# Patient Record
Sex: Male | Born: 1990 | Race: Black or African American | Hispanic: No | Marital: Single | Smoking: Current every day smoker
Health system: Southern US, Community
[De-identification: ages and names within clinical notes are randomized; demographics above are authoritative.]

## PROBLEM LIST (undated history)

## (undated) DIAGNOSIS — F419 Anxiety disorder, unspecified: Secondary | ICD-10-CM

## (undated) DIAGNOSIS — I10 Essential (primary) hypertension: Secondary | ICD-10-CM

## (undated) DIAGNOSIS — F32A Depression, unspecified: Secondary | ICD-10-CM

## (undated) HISTORY — DX: Anxiety disorder, unspecified: F41.9

## (undated) HISTORY — DX: Depression, unspecified: F32.A

---

## 2013-10-13 ENCOUNTER — Emergency Department: Payer: Self-pay | Admitting: Emergency Medicine

## 2013-10-13 LAB — CBC
HCT: 51.6 % (ref 40.0–52.0)
HGB: 17.6 g/dL (ref 13.0–18.0)
MCH: 32.7 pg (ref 26.0–34.0)
MCHC: 34.1 g/dL (ref 32.0–36.0)
MCV: 96 fL (ref 80–100)
PLATELETS: 214 10*3/uL (ref 150–440)
RBC: 5.4 10*6/uL (ref 4.40–5.90)
RDW: 12.8 % (ref 11.5–14.5)
WBC: 8.4 10*3/uL (ref 3.8–10.6)

## 2013-10-13 LAB — BASIC METABOLIC PANEL
ANION GAP: 4 — AB (ref 7–16)
BUN: 9 mg/dL (ref 7–18)
CALCIUM: 8.8 mg/dL (ref 8.5–10.1)
CHLORIDE: 106 mmol/L (ref 98–107)
Co2: 30 mmol/L (ref 21–32)
Creatinine: 0.89 mg/dL (ref 0.60–1.30)
EGFR (African American): >60
EGFR (Non-African Amer.): >60
Glucose: 102 mg/dL — ABNORMAL HIGH (ref 65–99)
Osmolality: 278 (ref 275–301)
POTASSIUM: 3.3 mmol/L — AB (ref 3.5–5.1)
SODIUM: 140 mmol/L (ref 136–145)

## 2013-10-13 LAB — TROPONIN I: Troponin-I: <0.02 ng/mL

## 2014-01-06 ENCOUNTER — Emergency Department: Payer: Self-pay | Admitting: Emergency Medicine

## 2014-02-27 ENCOUNTER — Emergency Department: Payer: Self-pay | Admitting: Emergency Medicine

## 2014-02-27 LAB — BASIC METABOLIC PANEL
Anion Gap: 7 (ref 7–16)
BUN: 9 mg/dL (ref 7–18)
Calcium, Total: 8.6 mg/dL (ref 8.5–10.1)
Chloride: 105 mmol/L (ref 98–107)
Co2: 29 mmol/L (ref 21–32)
Creatinine: 0.9 mg/dL (ref 0.60–1.30)
EGFR (African American): >60
EGFR (Non-African Amer.): >60
GLUCOSE: 90 mg/dL (ref 65–99)
Osmolality: 279 (ref 275–301)
Potassium: 3.8 mmol/L (ref 3.5–5.1)
Sodium: 141 mmol/L (ref 136–145)

## 2014-02-27 LAB — CBC
HCT: 51.1 % (ref 40.0–52.0)
HGB: 17.3 g/dL (ref 13.0–18.0)
MCH: 32.3 pg (ref 26.0–34.0)
MCHC: 33.8 g/dL (ref 32.0–36.0)
MCV: 96 fL (ref 80–100)
PLATELETS: 240 10*3/uL (ref 150–440)
RBC: 5.35 10*6/uL (ref 4.40–5.90)
RDW: 13.1 % (ref 11.5–14.5)
WBC: 8.1 10*3/uL (ref 3.8–10.6)

## 2014-02-27 LAB — TROPONIN I

## 2015-12-16 IMAGING — CR DG CHEST 2V
1 series · 2 of 2 positions shown · non-contrast
Comparison: 11/11/2008

CLINICAL DATA: Chest pain

EXAM:
CHEST  2 VIEW

[Series 1: pa · 0.17mm/px · 2 of 2 slices shown]
[im 1/2]
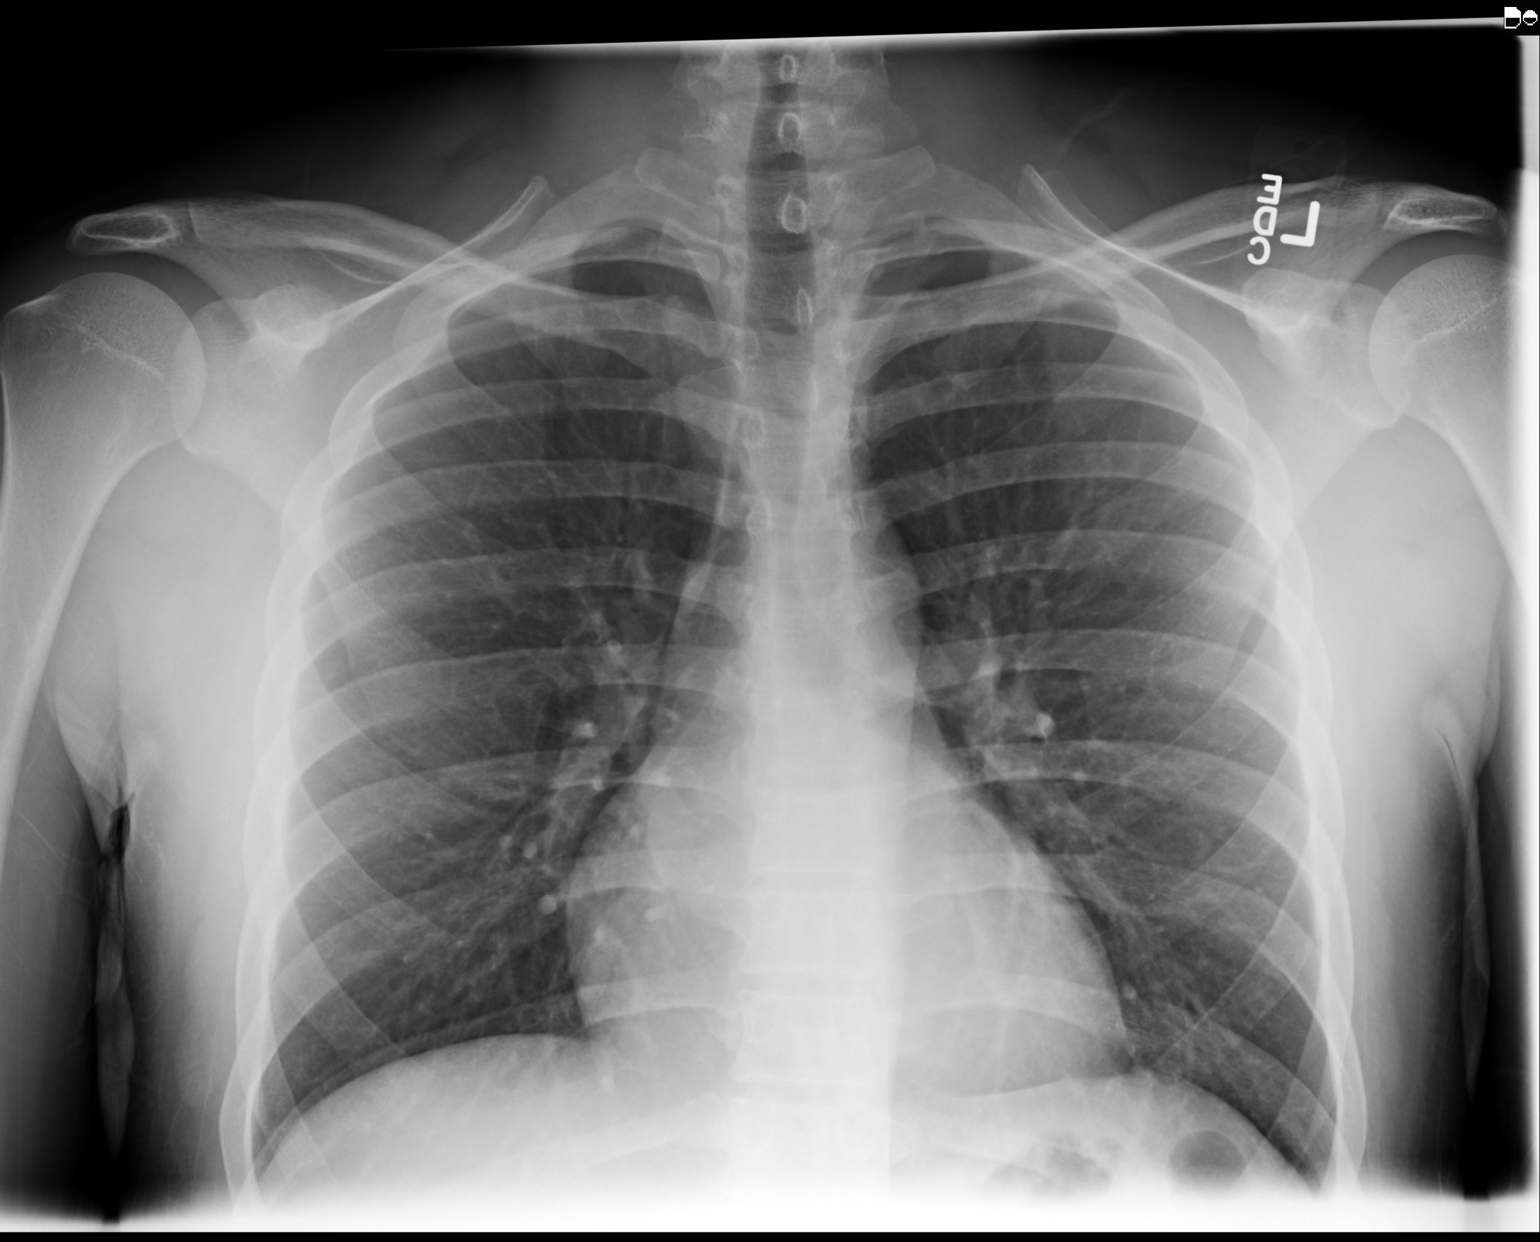
[im 2/2]
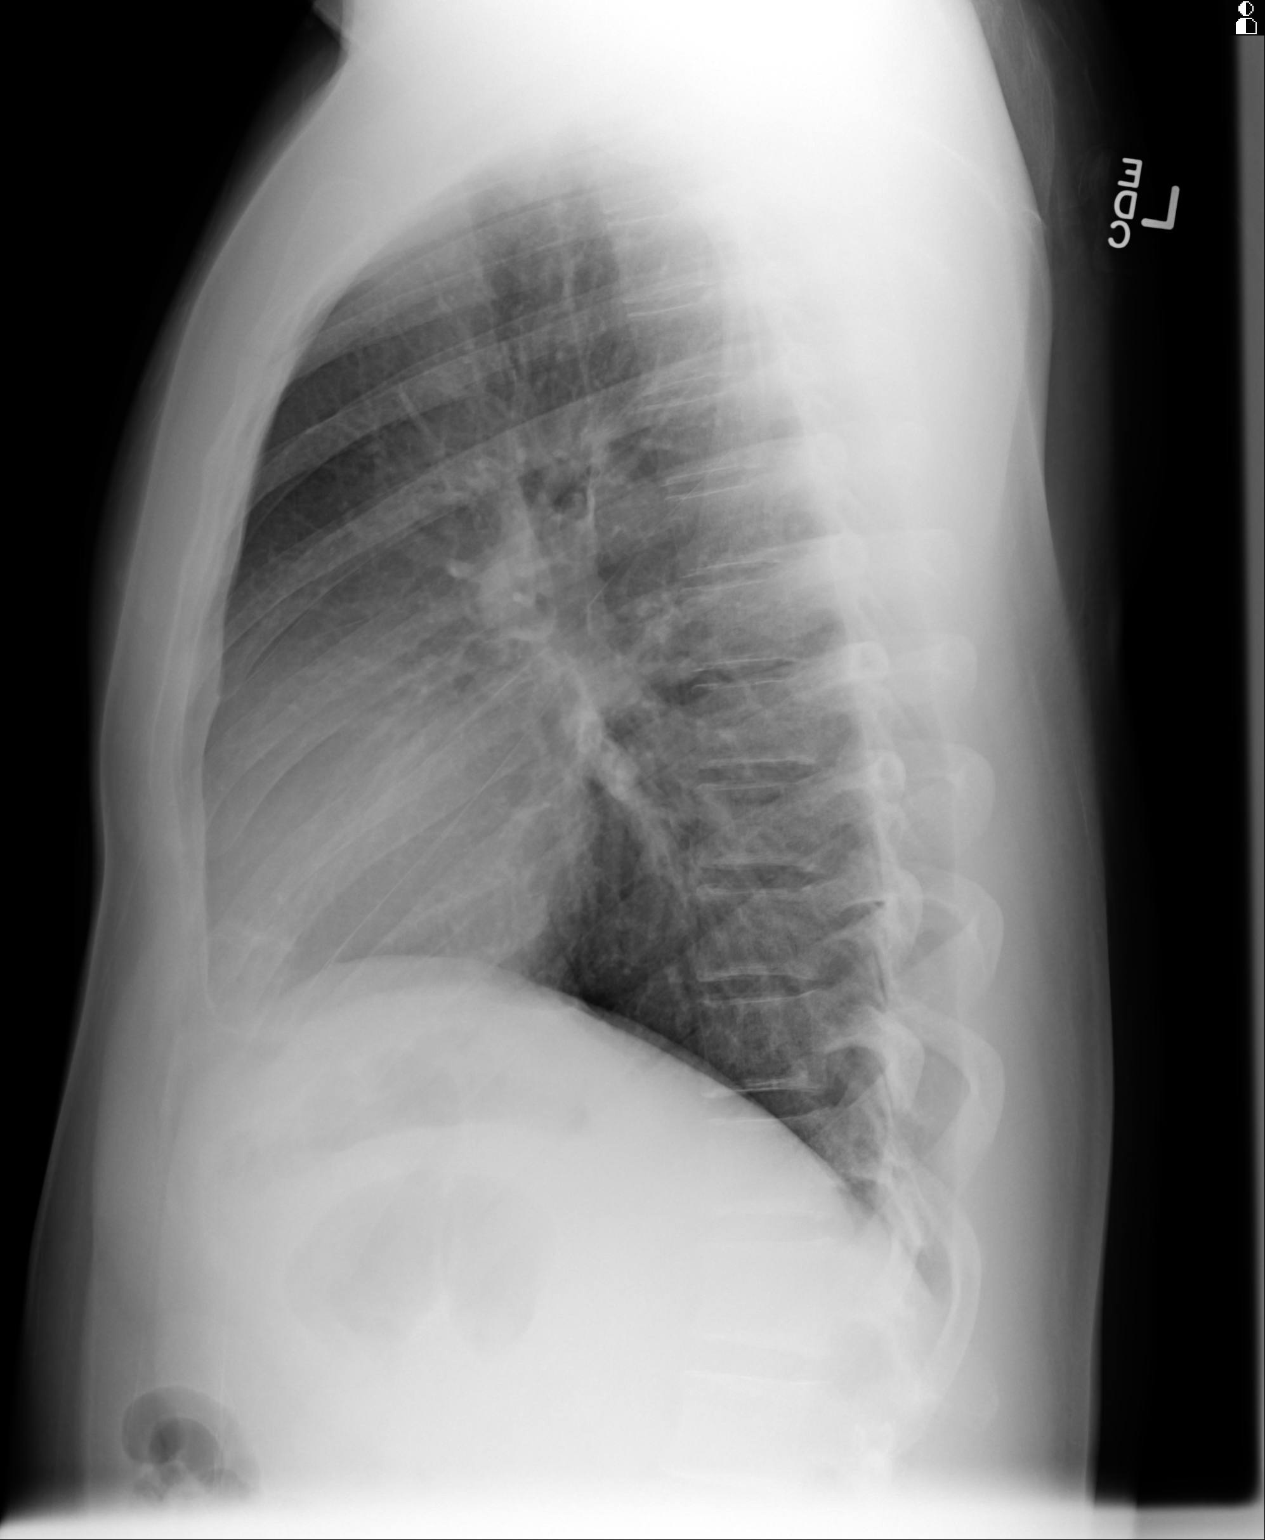

[2 of 2 positions shown; findings below may reference images not displayed]

FINDINGS: The heart size and mediastinal contours are within normal limits.
Both lungs are clear. The visualized skeletal structures are
unremarkable.
IMPRESSION: No active cardiopulmonary disease.

## 2016-10-04 ENCOUNTER — Emergency Department: Payer: PRIVATE HEALTH INSURANCE

## 2016-10-04 ENCOUNTER — Encounter: Payer: Self-pay | Admitting: Emergency Medicine

## 2016-10-04 ENCOUNTER — Emergency Department
Admission: EM | Admit: 2016-10-04 | Discharge: 2016-10-04 | Disposition: A | Discharge status: Home / Self Care | Payer: PRIVATE HEALTH INSURANCE | Attending: Emergency Medicine | Admitting: Emergency Medicine

## 2016-10-04 DIAGNOSIS — R51 Headache: Secondary | ICD-10-CM | POA: Diagnosis not present

## 2016-10-04 DIAGNOSIS — I10 Essential (primary) hypertension: Secondary | ICD-10-CM | POA: Diagnosis not present

## 2016-10-04 DIAGNOSIS — R519 Headache, unspecified: Secondary | ICD-10-CM

## 2016-10-04 HISTORY — DX: Essential (primary) hypertension: I10

## 2016-10-04 LAB — BASIC METABOLIC PANEL
ANION GAP: 9 (ref 5–15)
BUN: 12 mg/dL (ref 6–20)
CO2: 26 mmol/L (ref 22–32)
Calcium: 9.4 mg/dL (ref 8.9–10.3)
Chloride: 105 mmol/L (ref 101–111)
Creatinine, Ser: 0.86 mg/dL (ref 0.61–1.24)
GFR calc Af Amer: >60 mL/min (ref >60)
GLUCOSE: 100 mg/dL — AB (ref 65–99)
POTASSIUM: 3.9 mmol/L (ref 3.5–5.1)
Sodium: 140 mmol/L (ref 135–145)

## 2016-10-04 LAB — CBC
HEMATOCRIT: 49.1 % (ref 40.0–52.0)
Hemoglobin: 17.2 g/dL (ref 13.0–18.0)
MCH: 32.8 pg (ref 26.0–34.0)
MCHC: 35 g/dL (ref 32.0–36.0)
MCV: 93.6 fL (ref 80.0–100.0)
PLATELETS: 260 10*3/uL (ref 150–440)
RBC: 5.24 MIL/uL (ref 4.40–5.90)
RDW: 13.1 % (ref 11.5–14.5)
WBC: 6.4 10*3/uL (ref 3.8–10.6)

## 2016-10-04 MED ORDER — BUTALBITAL-APAP-CAFFEINE 50-325-40 MG PO TABS
1.0000 | ORAL_TABLET | Freq: Once | ORAL | Status: AC
  Administered 2016-10-04: 1 via ORAL
  Filled 2016-10-04: qty 1

## 2016-10-04 MED ORDER — AMLODIPINE BESYLATE 5 MG PO TABS: 5.0000 mg | ORAL_TABLET | Freq: Every day | ORAL | 1 refills | Status: DC

## 2016-10-04 NOTE — ED Provider Notes (Signed)
[facility] Emergency Department Provider Note  ____________________________________________   I have reviewed the triage vital signs and the nursing notes.   HISTORY  Chief Complaint Headache   History limited by: Not Limited   HPI Dave Mcintyre is a 26 y.o. male who presents to the emergency department today because of concerns for headache. It is located in his left temple. It had been severe however has gradually improved. It started last night. Patient was up going to the bathroom when he first noticed it. He has noticed that bright lights make it worse. He has not had any associated vomiting. He denies any similar symptoms in the past. Denies any recent trauma to his head.   Past Medical History:  Diagnosis Date  . Hypertension     There are no active problems to display for this patient.   History reviewed. No pertinent surgical history.  Prior to Admission medications   Not on File    Allergies Penicillins  No family history on file.  Social History Social History  Substance Use Topics  . Smoking status: Never Smoker  . Smokeless tobacco: Never Used  . Alcohol use Yes    Review of Systems Constitutional: No fever/chills Eyes: Positive for sensitivity to light.  ENT: No sore throat. Cardiovascular: Denies chest pain. Respiratory: Denies shortness of breath. Gastrointestinal: No abdominal pain.  No nausea, no vomiting.  No diarrhea.   Genitourinary: Negative for dysuria. Musculoskeletal: Negative for back pain. Skin: Negative for rash. Neurological: Positive for headache.  ____________________________________________   PHYSICAL EXAM:  VITAL SIGNS: ED Triage Vitals  Enc Vitals Group     BP 10/04/16 1156 (!) 171/106     Pulse Rate 10/04/16 1156 70     Resp 10/04/16 1156 14     Temp 10/04/16 1156 97.6 F (36.4 C)     Temp Source 10/04/16 1156 Oral     SpO2 10/04/16 1156 100 %     Weight 10/04/16 1157 205 lb (93 kg)      Height 10/04/16 1157 5\' 9"  (1.753 m)     Head Circumference --      Peak Flow --      Pain Score 10/04/16 1156 7   Constitutional: Alert and oriented. Well appearing and in no distress. Eyes: Conjunctivae are normal.  ENT   Head: Normocephalic and atraumatic.   Nose: No congestion/rhinnorhea.   Mouth/Throat: Mucous membranes are moist.   Neck: No stridor. Hematological/Lymphatic/Immunilogical: No cervical lymphadenopathy. Cardiovascular: Normal rate, regular rhythm.  No murmurs, rubs, or gallops.  Respiratory: Normal respiratory effort without tachypnea nor retractions. Breath sounds are clear and equal bilaterally. No wheezes/rales/rhonchi. Gastrointestinal: Soft and non tender. No rebound. No guarding.  Genitourinary: Deferred Musculoskeletal: Normal range of motion in all extremities. No lower extremity edema. Neurologic:  Normal speech and language. No gross focal neurologic deficits are appreciated.  Skin:  Skin is warm, dry and intact. No rash noted. Psychiatric: Mood and affect are normal. Speech and behavior are normal. Patient exhibits appropriate insight and judgment.  ____________________________________________    LABS (pertinent positives/negatives)  Labs Reviewed  BASIC METABOLIC PANEL - Abnormal; Notable for the following:       Result Value   Glucose, Bld 100 (*)    All other components within normal limits  CBC     ____________________________________________   EKG  None  ____________________________________________    RADIOLOGY  CT head IMPRESSION: Normal head CT. No abnormality seen to explain headache. ____________________________________________   PROCEDURES  Procedures  ____________________________________________   INITIAL IMPRESSION / ASSESSMENT AND PLAN / ED COURSE  Pertinent labs & imaging results that were available during my care of the patient were reviewed by me and considered in my medical decision making  (see chart for details).  Patient presented to the emergency department today because of concerns for headache. At the time of examination paced stated he was already feeling better. Patient was given Fioricet and stated that takes pain down to a 1 out of 10. Head CT was negative. At this time do not feel any further emergent imaging or workup is necessary given clinic improvement. Will start patient on blood pressure medication given elevation. Will give patient primary care follow up.  ____________________________________________   FINAL CLINICAL IMPRESSION(S) / ED DIAGNOSES  Final diagnoses:  Bad headache  Hypertension, unspecified type     Note: This dictation was prepared with Dragon dictation. Any transcriptional errors that result from this process are unintentional     Phineas SemenGoodman, Aydenn Gervin, MD 10/04/16 1630

## 2016-10-04 NOTE — Discharge Instructions (Signed)
Please seek medical attention for any high fevers, chest pain, shortness of breath, change in behavior, persistent vomiting, bloody stool or any other new or concerning symptoms.  

## 2016-10-04 NOTE — ED Notes (Signed)
E-signature pad unavailable at this time.  Patient verbalized understanding of all discharge instructions and prescriptions.  

## 2016-10-04 NOTE — ED Triage Notes (Signed)
Woke up last night and had headache left temple area that was throbbing.  Took ibuprofen without relief. Could not sleep.  [facility] and that has not helped either.  Head no longer throbbing, but is contstant pain.

## 2018-02-03 ENCOUNTER — Emergency Department
Admission: EM | Admit: 2018-02-03 | Discharge: 2018-02-03 | Disposition: A | Discharge status: Home / Self Care | Payer: PRIVATE HEALTH INSURANCE | Attending: Emergency Medicine | Admitting: Emergency Medicine

## 2018-02-03 ENCOUNTER — Encounter: Payer: Self-pay | Admitting: Emergency Medicine

## 2018-02-03 DIAGNOSIS — H10023 Other mucopurulent conjunctivitis, bilateral: Secondary | ICD-10-CM | POA: Insufficient documentation

## 2018-02-03 DIAGNOSIS — Z91199 Patient's noncompliance with other medical treatment and regimen due to unspecified reason: Secondary | ICD-10-CM

## 2018-02-03 DIAGNOSIS — Z9119 Patient's noncompliance with other medical treatment and regimen: Secondary | ICD-10-CM

## 2018-02-03 DIAGNOSIS — H109 Unspecified conjunctivitis: Secondary | ICD-10-CM

## 2018-02-03 DIAGNOSIS — I10 Essential (primary) hypertension: Secondary | ICD-10-CM | POA: Insufficient documentation

## 2018-02-03 MED ORDER — TOBRAMYCIN 0.3 % OP SOLN: 2.0000 [drp] | OPHTHALMIC | 0 refills | Status: DC

## 2018-02-03 MED ORDER — AMLODIPINE BESYLATE 5 MG PO TABS: 5.0000 mg | ORAL_TABLET | Freq: Every day | ORAL | 1 refills | Status: DC

## 2018-02-03 NOTE — ED Provider Notes (Signed)
[facility] Emergency Department Provider Note   ____________________________________________   None    (approximate)  I have reviewed the triage vital signs and the nursing notes.   HISTORY  Chief Complaint Conjunctivitis and Eye Drainage   HPI Dave Mcintyre is a 27 y.o. male since to the ED with complaint of conjunctivitis to both eyes.  Patient states that his son was diagnosed with same last week.  He believes that he picked it up from his toddler child.  He states that he woke this morning with his lashes matted shut.  He denies any eye pain or foreign body sensation.  He states his eyes have been red and draining.  He tried some over-the-counter drops without any relief.  He rates his pain as 4 out of 10.   Past Medical History:  Diagnosis Date  . Hypertension     There are no active problems to display for this patient.   History reviewed. No pertinent surgical history.  Prior to Admission medications   Medication Sig Start Date End Date Taking? Authorizing Provider  amLODipine (NORVASC) 5 MG tablet Take 1 tablet (5 mg total) by mouth daily. 02/03/18 02/03/19  Tommi RumpsSummers, Rhonda L, PA-C  tobramycin (TOBREX) 0.3 % ophthalmic solution Place 2 drops into both eyes every 4 (four) hours. While awake 02/03/18   Tommi RumpsSummers, Rhonda L, PA-C    Allergies Penicillins  No family history on file.  Social History Social History   Tobacco Use  . Smoking status: Never Smoker  . Smokeless tobacco: Never Used  Substance Use Topics  . Alcohol use: Yes  . Drug use: Not on file    Review of Systems Constitutional: No fever/chills Eyes: No visual changes.  Erythematous and draining bilateral eyes. ENT: No sore throat. Cardiovascular: Denies chest pain. Respiratory: Denies shortness of breath. Musculoskeletal: Negative for muscle aches. Skin: Negative for rash. Neurological: Negative for  headaches ____________________________________________   PHYSICAL EXAM:  VITAL SIGNS: ED Triage Vitals  Enc Vitals Group     BP 02/03/18 0744 (!) 156/110     Pulse Rate 02/03/18 0744 93     Resp 02/03/18 0744 18     Temp 02/03/18 0744 98.2 F (36.8 C)     Temp Source 02/03/18 0744 Oral     SpO2 02/03/18 0744 100 %     Weight 02/03/18 0742 200 lb (90.7 kg)     Height 02/03/18 0742 5\' 9"  (1.753 m)     Head Circumference --      Peak Flow --      Pain Score 02/03/18 0742 4     Pain Loc --      Pain Edu? --      Excl. in [place]? --    Constitutional: Alert and oriented. Well appearing and in no acute distress. Eyes: Conjunctivae are erythematous bilaterally.  Thin yellow exudate present  PERRL. EOMI. lashes have exudate bilaterally. Head: Atraumatic. Nose: No congestion/rhinnorhea. Mouth/Throat: Mucous membranes are moist.  Oropharynx non-erythematous. Neck: No stridor.   Cardiovascular: Normal rate, regular rhythm. Grossly normal heart sounds.  Good peripheral circulation. Respiratory: Normal respiratory effort.  No retractions. Lungs CTAB. Musculoskeletal: Moves upper and lower extremities with any difficulty.  Normal gait was noted. Neurologic:  Normal speech and language. No gross focal neurologic deficits are appreciated.  Skin:  Skin is warm, dry and intact. No rash noted. Psychiatric: Mood and affect are normal. Speech and behavior are normal.  ____________________________________________   LABS (all labs ordered  are listed, but only abnormal results are displayed)  Labs Reviewed - No data to display   PROCEDURES  Procedure(s) performed: None  Procedures  Critical Care performed: No  ____________________________________________   INITIAL IMPRESSION / ASSESSMENT AND PLAN / ED COURSE  As part of my medical decision making, I reviewed the following data within the electronic MEDICAL RECORD NUMBER Notes from prior ED visits and [place] Controlled Substance  Database  Patient presents to the ED with complaint of bilateral conjunctivitis with lashes matted shut this morning.  Patient states that he has been using over-the-counter medication without any relief.  He states that his son was diagnosed with pinkeye last week and had similar symptoms.  He denies any injury to his eye or foreign body sensation.  Exam is consistent with conjunctivitis.  Patient was given a prescription for Tobrex ophthalmic solution 2 drops every 4 hours while awake.  He also was given a prescription for Norvasc 5 mg 1 daily with a list of clinics to follow-up with and information about hypertension.  Patient was made aware that is dangerous to take his medication on a on and off basis.  He was strongly encouraged to obtain a primary care provider.  ____________________________________________   FINAL CLINICAL IMPRESSION(S) / ED DIAGNOSES  Final diagnoses:  Bacterial conjunctivitis of both eyes  Hypertension, uncontrolled  Medically noncompliant     ED Discharge Orders         Ordered    tobramycin (TOBREX) 0.3 % ophthalmic solution  Every 4 hours     02/03/18 0858    amLODipine (NORVASC) 5 MG tablet  Daily     02/03/18 0904           Note:  This document was prepared using Dragon voice recognition software and may include unintentional dictation errors.    Tommi Rumps, PA-C 02/03/18 1148    Jeanmarie Plant, MD 02/03/18 [phone]

## 2018-02-03 NOTE — Discharge Instructions (Signed)
Begin using eyedrops today every 4 hours while awake.  Wash your hands each time you touch her eyes.  This is very contagious and could be spread.  Also begin taking your blood pressure medication every day.  There are a list of medical clinics listed on your discharge papers call on Monday to see which of these are taking new patients and get a primary care provider established soon.

## 2018-02-03 NOTE — ED Triage Notes (Signed)
Pt reports thinks he has pink eye in both eyes. Pt reports son had it first and a week later he started with sx's. Pt reports when he wakes up in the morning his eyes are sealed shut and they itch. Noth eyes are red and draining.

## 2018-02-03 NOTE — ED Notes (Signed)
See triage note  Presents with right eye redness and draining   States sx's started about 4-5 days ago  Became worse yesterday

## 2018-08-18 ENCOUNTER — Emergency Department
Admission: EM | Admit: 2018-08-18 | Discharge: 2018-08-18 | Disposition: A | Discharge status: Home / Self Care | Payer: PRIVATE HEALTH INSURANCE | Attending: Emergency Medicine | Admitting: Emergency Medicine

## 2018-08-18 ENCOUNTER — Other Ambulatory Visit: Payer: Self-pay

## 2018-08-18 ENCOUNTER — Emergency Department: Payer: PRIVATE HEALTH INSURANCE

## 2018-08-18 ENCOUNTER — Encounter: Payer: Self-pay | Admitting: Intensive Care

## 2018-08-18 DIAGNOSIS — R109 Unspecified abdominal pain: Secondary | ICD-10-CM

## 2018-08-18 DIAGNOSIS — R1011 Right upper quadrant pain: Secondary | ICD-10-CM | POA: Insufficient documentation

## 2018-08-18 DIAGNOSIS — I10 Essential (primary) hypertension: Secondary | ICD-10-CM | POA: Insufficient documentation

## 2018-08-18 LAB — URINALYSIS, COMPLETE (UACMP) WITH MICROSCOPIC
Bacteria, UA: NONE SEEN
Bilirubin Urine: NEGATIVE
Glucose, UA: NEGATIVE mg/dL
Hgb urine dipstick: NEGATIVE
Ketones, ur: NEGATIVE mg/dL
Leukocytes,Ua: NEGATIVE
Nitrite: NEGATIVE
Protein, ur: NEGATIVE mg/dL
Specific Gravity, Urine: 1.019 (ref 1.005–1.030)
pH: 6 (ref 5.0–8.0)

## 2018-08-18 LAB — CBC
HCT: 47.2 % (ref 39.0–52.0)
Hemoglobin: 16.1 g/dL (ref 13.0–17.0)
MCH: 31.8 pg (ref 26.0–34.0)
MCHC: 34.1 g/dL (ref 30.0–36.0)
MCV: 93.3 fL (ref 80.0–100.0)
Platelets: 310 10*3/uL (ref 150–400)
RBC: 5.06 MIL/uL (ref 4.22–5.81)
RDW: 11.7 % (ref 11.5–15.5)
WBC: 6.9 10*3/uL (ref 4.0–10.5)
nRBC: 0 % (ref 0.0–0.2)

## 2018-08-18 LAB — COMPREHENSIVE METABOLIC PANEL
ALT: 51 U/L — ABNORMAL HIGH (ref 0–44)
AST: 41 U/L (ref 15–41)
Albumin: 4.1 g/dL (ref 3.5–5.0)
Alkaline Phosphatase: 113 U/L (ref 38–126)
Anion gap: 10 (ref 5–15)
BUN: 15 mg/dL (ref 6–20)
CO2: 26 mmol/L (ref 22–32)
Calcium: 9.2 mg/dL (ref 8.9–10.3)
Chloride: 103 mmol/L (ref 98–111)
Creatinine, Ser: 0.81 mg/dL (ref 0.61–1.24)
GFR calc Af Amer: >60 mL/min (ref >60)
GFR calc non Af Amer: >60 mL/min (ref >60)
Glucose, Bld: 107 mg/dL — ABNORMAL HIGH (ref 70–99)
Potassium: 3.7 mmol/L (ref 3.5–5.1)
Sodium: 139 mmol/L (ref 135–145)
Total Bilirubin: 0.6 mg/dL (ref 0.3–1.2)
Total Protein: 8 g/dL (ref 6.5–8.1)

## 2018-08-18 LAB — LIPASE, BLOOD: Lipase: 31 U/L (ref 11–51)

## 2018-08-18 MED ORDER — CYCLOBENZAPRINE HCL 10 MG PO TABS: 10.0000 mg | ORAL_TABLET | Freq: Three times a day (TID) | ORAL | 0 refills | Status: DC | PRN

## 2018-08-18 MED ORDER — POLYETHYLENE GLYCOL 3350 17 G PO PACK: 17.0000 g | PACK | Freq: Every day | ORAL | 0 refills | Status: DC

## 2018-08-18 NOTE — ED Notes (Signed)
Pt back to room.

## 2018-08-18 NOTE — ED Triage Notes (Addendum)
Patient reports ever since eating [employer] [place] [employer] about a week ago he was having constipation and abd upset. Now he is experiencing right sided flank pain. Denies N/V/D. Patient has been out of his amlodipine X3 months

## 2018-08-18 NOTE — ED Notes (Signed)
Pt ambulatory with steady gait to treatment room. No distress noted. Mask in place.

## 2018-08-18 NOTE — ED Notes (Signed)
Pt states he ate a lot of cheese at [employer] and that cheese makes him constipated- pain located in the right flank- NAD noted

## 2018-08-18 NOTE — ED Provider Notes (Signed)
[facility] Emergency Department Provider Note       Time seen: ----------------------------------------- 1:21 PM on 08/18/2018 -----------------------------------------   I have reviewed the triage vital signs and the nursing notes.  HISTORY   Chief Complaint Flank Pain    HPI Dave Mcintyre is a 28 y.o. male with a history of hypertension who presents to the ED for constipation with abdominal upset.  He has experienced right flank pain since eating at [employer] [employer] about a week ago.  He denies fevers, chills, nausea or vomiting.  On arrival he was noted to be hypertensive, reports he has been out of his blood pressure medicines for 3 months.  Past Medical History:  Diagnosis Date  . Hypertension     There are no active problems to display for this patient.   History reviewed. No pertinent surgical history.  Allergies Penicillins  Social History Social History   Tobacco Use  . Smoking status: Never Smoker  . Smokeless tobacco: Never Used  Substance Use Topics  . Alcohol use: Yes  . Drug use: Not on file   Review of Systems Constitutional: Negative for fever. Cardiovascular: Negative for chest pain. Respiratory: Negative for shortness of breath. Gastrointestinal: Positive for abdominal pain, constipation Musculoskeletal: Negative for back pain. Skin: Negative for rash. Neurological: Negative for headaches, focal weakness or numbness.  All systems negative/normal/unremarkable except as stated in the HPI  ____________________________________________   PHYSICAL EXAM:  VITAL SIGNS: ED Triage Vitals  Enc Vitals Group     BP 08/18/18 1206 (!) 181/110     Pulse Rate 08/18/18 1206 (!) 107     Resp 08/18/18 1206 16     Temp 08/18/18 1206 98.4 F (36.9 C)     Temp Source 08/18/18 1206 Oral     SpO2 08/18/18 1206 100 %     Weight 08/18/18 1208 215 lb (97.5 kg)     Height 08/18/18 1208 6' (1.829 m)     Head Circumference --       Peak Flow --      Pain Score 08/18/18 1207 4     Pain Loc --      Pain Edu? --      Excl. in [place]? --    Constitutional: Alert and oriented. Well appearing and in no distress. Eyes: Conjunctivae are normal. Normal extraocular movements. Cardiovascular: Normal rate, regular rhythm. No murmurs, rubs, or gallops. Respiratory: Normal respiratory effort without tachypnea nor retractions. Breath sounds are clear and equal bilaterally. No wheezes/rales/rhonchi. Gastrointestinal: Mild nonfocal tenderness, no rebound or guarding.  Normal bowel sounds. Musculoskeletal: Nontender with normal range of motion in extremities. No lower extremity tenderness nor edema. Neurologic:  Normal speech and language. No gross focal neurologic deficits are appreciated.  Skin:  Skin is warm, dry and intact. No rash noted. Psychiatric: Mood and affect are normal. Speech and behavior are normal.  ____________________________________________  ED COURSE:  As part of my medical decision making, I reviewed the following data within the [place] History obtained from family if available, nursing notes, old chart and ekg, as well as notes from prior ED visits. Patient presented for abdominal pain, we will assess with labs and imaging as indicated at this time.   Procedures  Dave Mcintyre was evaluated in Emergency Department on 08/18/2018 for the symptoms described in the history of present illness. He was evaluated in the context of the global COVID-19 pandemic, which necessitated consideration that the patient might be at risk  for infection with the SARS-CoV-2 virus that causes COVID-19. Institutional protocols and algorithms that pertain to the evaluation of patients at risk for COVID-19 are in a state of rapid change based on information released by regulatory bodies including the CDC and federal and state organizations. These policies and algorithms were followed during the patient's care in the ED.   ____________________________________________   LABS (pertinent positives/negatives)  Labs Reviewed  COMPREHENSIVE METABOLIC PANEL - Abnormal; Notable for the following components:      Result Value   Glucose, Bld 107 (*)    ALT 51 (*)    All other components within normal limits  URINALYSIS, COMPLETE (UACMP) WITH MICROSCOPIC - Abnormal; Notable for the following components:   Color, Urine YELLOW (*)    APPearance CLEAR (*)    All other components within normal limits  LIPASE, BLOOD  CBC    RADIOLOGY Images were viewed by me  Abdomen 2 view IMPRESSION: Nonspecific bowel gas pattern with relative paucity of bowel gas present. Air-filled loop of bowel in the left mid to upper abdomen likely normal colonic loop, although a mildly dilated small bowel loop is possible. Recommend follow-up serial abdominal films as clinically indicated. ____________________________________________   DIFFERENTIAL DIAGNOSIS   Constipation, hypertension, obstruction, renal colic  FINAL ASSESSMENT AND PLAN  Constipation, hypertension   Plan: The patient had presented for abdominal pain and was also noted to be hypertensive. Patient's labs were reassuring. Patient's imaging did not reveal any acute process.  He is either dealing with gas pain, constipation, muscle strain or combination thereof.  He will be discharged with muscle relaxants and laxative.   Ulice DashJohnathan E Williams, MD    Note: This note was generated in part or whole with voice recognition software. Voice recognition is usually quite accurate but there are transcription errors that can and very often do occur. I apologize for any typographical errors that were not detected and corrected.     Emily FilbertWilliams, Jonathan E, MD 08/18/18 1440

## 2018-08-18 NOTE — ED Notes (Signed)
EDP at bedside  

## 2018-08-18 NOTE — ED Notes (Signed)
Pt to xray

## 2019-11-08 ENCOUNTER — Emergency Department
Admission: EM | Admit: 2019-11-08 | Discharge: 2019-11-08 | Disposition: A | Discharge status: Home / Self Care | Payer: Self-pay | Attending: Emergency Medicine | Admitting: Emergency Medicine

## 2019-11-08 ENCOUNTER — Emergency Department: Payer: Self-pay

## 2019-11-08 ENCOUNTER — Other Ambulatory Visit: Payer: Self-pay

## 2019-11-08 DIAGNOSIS — Z23 Encounter for immunization: Secondary | ICD-10-CM | POA: Insufficient documentation

## 2019-11-08 DIAGNOSIS — S61412A Laceration without foreign body of left hand, initial encounter: Secondary | ICD-10-CM | POA: Insufficient documentation

## 2019-11-08 DIAGNOSIS — Y929 Unspecified place or not applicable: Secondary | ICD-10-CM | POA: Insufficient documentation

## 2019-11-08 DIAGNOSIS — Y9389 Activity, other specified: Secondary | ICD-10-CM | POA: Insufficient documentation

## 2019-11-08 DIAGNOSIS — Y990 Civilian activity done for income or pay: Secondary | ICD-10-CM | POA: Insufficient documentation

## 2019-11-08 DIAGNOSIS — W260XXA Contact with knife, initial encounter: Secondary | ICD-10-CM | POA: Insufficient documentation

## 2019-11-08 DIAGNOSIS — Z79899 Other long term (current) drug therapy: Secondary | ICD-10-CM | POA: Insufficient documentation

## 2019-11-08 DIAGNOSIS — I10 Essential (primary) hypertension: Secondary | ICD-10-CM | POA: Insufficient documentation

## 2019-11-08 MED ORDER — TETANUS-DIPHTH-ACELL PERTUSSIS 5-2.5-18.5 LF-MCG/0.5 IM SUSP
0.5000 mL | Freq: Once | INTRAMUSCULAR | Status: AC
  Administered 2019-11-08: 18:00:00 0.5 mL via INTRAMUSCULAR
  Filled 2019-11-08: qty 0.5

## 2019-11-08 MED ORDER — LIDOCAINE HCL (PF) 1 % IJ SOLN
5.0000 mL | Freq: Once | INTRAMUSCULAR | Status: AC
  Administered 2019-11-08: 18:00:00 5 mL
  Filled 2019-11-08: qty 5

## 2019-11-08 NOTE — ED Provider Notes (Signed)
[facility] Emergency Department Provider Note  ____________________________________________   First MD Initiated Contact with Patient 11/08/19 1704     (approximate)  I have reviewed the triage vital signs and the nursing notes.   HISTORY  Chief Complaint Laceration    HPI Dave Mcintyre is a 29 y.o. male presents emergency department complaint of laceration to the left hand while at work.  Patient does not want to file Worker's Comp.  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  Unsure of his last tetanus.  No other complaints at this time    Past Medical History:  Diagnosis Date  . Hypertension     There are no problems to display for this patient.   History reviewed. No pertinent surgical history.  Prior to Admission medications   Medication Sig Start Date End Date Taking? Authorizing Provider  amLODipine (NORVASC) 5 MG tablet Take 1 tablet (5 mg total) by mouth daily. 02/03/18 02/03/19  Tommi Rumps, PA-C    Allergies Penicillins  History reviewed. No pertinent family history.  Social History Social History   Tobacco Use  . Smoking status: Never Smoker  . Smokeless tobacco: Never Used  Substance Use Topics  . Alcohol use: Yes  . Drug use: Not on file    Review of Systems  Constitutional: No fever/chills Eyes: No visual changes. ENT: No sore throat. Respiratory: Denies cough Genitourinary: Negative for dysuria. Musculoskeletal: Negative for back pain.  Positive for laceration to the left hand Skin: Negative for rash. Psychiatric: no mood changes,     ____________________________________________   PHYSICAL EXAM:  VITAL SIGNS: ED Triage Vitals  Enc Vitals Group     BP 11/08/19 1612 (!) 147/94     Pulse Rate 11/08/19 1612 (!) 110     Resp 11/08/19 1612 18     Temp 11/08/19 1612 98.5 F (36.9 C)     Temp Source 11/08/19 1612 Oral      SpO2 11/08/19 1612 99 %     Weight 11/08/19 1611 213 lb (96.6 kg)     Height 11/08/19 1611 5\' 9"  (1.753 m)     Head Circumference --      Peak Flow --      Pain Score 11/08/19 1611 6     Pain Loc --      Pain Edu? --      Excl. in GC? --     Constitutional: Alert and oriented. Well appearing and in no acute distress. Eyes: Conjunctivae are normal.  Head: Atraumatic. Nose: No congestion/rhinnorhea. Mouth/Throat: Mucous membranes are moist.   Neck:  supple no lymphadenopathy noted Cardiovascular: Normal rate, regular rhythm. Respiratory: Normal respiratory effort.  No retractions,  GU: deferred Musculoskeletal: FROM all extremities, warm and well perfused, left thumb has a deep stab-like laceration noted on the base of the thumb into the thenar muscle, area is swollen and tender, no foreign body noted from the outside, bleeding is controlled at this time, neurovascular is intact Neurologic:  Normal speech and language.  Skin:  Skin is warm, dry, positive laceration to the left hand. No rash noted. Psychiatric: Mood and affect are normal. Speech and behavior are normal.  ____________________________________________   LABS (all labs ordered are listed, but only abnormal results are displayed)  Labs Reviewed - No data to display ____________________________________________   ____________________________________________  RADIOLOGY  X-ray of the left thumb is  negative  ____________________________________________   PROCEDURES  Procedure(s) performed:   Marland KitchenMarland KitchenLaceration Repair  Date/Time: 11/08/2019 6:03 PM Performed by: Faythe Ghee, PA-C Authorized by: Faythe Ghee, PA-C   Consent:    Consent obtained:  Verbal   Consent given by:  Patient   Risks discussed:  Infection, pain, retained foreign body, poor cosmetic result, tendon damage and poor wound healing Anesthesia (see MAR for exact dosages):    Anesthesia method:  Local infiltration   Local anesthetic:   Lidocaine 1% w/o epi Laceration details:    Location:  Hand   Hand location:  L palm   Length (cm):  2 Repair type:    Repair type:  Simple Pre-procedure details:    Preparation:  Patient was prepped and draped in usual sterile fashion Exploration:    Hemostasis achieved with:  Direct pressure   Wound exploration: wound explored through full range of motion     Wound extent: no foreign bodies/material noted, no muscle damage noted, no nerve damage noted, no tendon damage noted, no underlying fracture noted and no vascular damage noted   Treatment:    Area cleansed with:  Betadine and saline   Amount of cleaning:  Standard   Irrigation solution:  Sterile saline   Irrigation method:  Syringe and tap Skin repair:    Repair method:  Sutures   Suture size:  5-0   Suture material:  Nylon   Suture technique:  Simple interrupted   Number of sutures:  3 Approximation:    Approximation:  Close Post-procedure details:    Dressing:  Non-adherent dressing   Patient tolerance of procedure:  Tolerated well, no immediate complications Comments:     Laceration located on thenar muscle of left hand close to thumb      ____________________________________________   INITIAL IMPRESSION / ASSESSMENT AND PLAN / ED COURSE  Pertinent labs & imaging results that were available during my care of the patient were reviewed by me and considered in my medical decision making (see chart for details).   Patient is a 29 year old male presents emergency department with concerns of laceration to the left hand.  Patient is right-handed.  See HPI physical exam shows patient to appear well there is tenderness and swelling noted along the left thumb base and thenar muscle.  Tdap will be updated here in the ED.  X-ray of the left thumb  See procedure note for repair  Patient was given suture removal instructions.  He is to keep the areas clean and dry as possible.  Keep the area covered while at work.   Explained to him he could not wear plastic gloves but he could wear a leather glove if desired.  He is to return in 1 week for suture removal or remove them himself.  He states he understands.  Tdap was updated and he was discharged in stable condition.     EVEREST BROD was evaluated in Emergency Department on 11/08/2019 for the symptoms described in the history of present illness. He was evaluated in the context of the global COVID-19 pandemic, which necessitated consideration that the patient might be at risk for infection with the SARS-CoV-2 virus that causes COVID-19. Institutional protocols and algorithms that pertain to the evaluation of patients at risk for COVID-19 are in a state of rapid change based on information released by regulatory bodies including the CDC and federal and state organizations. These policies and algorithms were followed during the patient's care in the ED.  As part of my medical decision making, I reviewed the following data within the electronic MEDICAL RECORD NUMBER Nursing notes reviewed and incorporated, Old chart reviewed, Radiograph reviewed , Notes from prior ED visits and [place] Controlled Substance Database  ____________________________________________   FINAL CLINICAL IMPRESSION(S) / ED DIAGNOSES  Final diagnoses:  Laceration of left hand without foreign body, initial encounter      NEW MEDICATIONS STARTED DURING THIS VISIT:  Current Discharge Medication List       Note:  This document was prepared using Dragon voice recognition software and may include unintentional dictation errors.    Faythe Ghee, PA-C 11/08/19 1806    Chesley Noon, MD 11/08/19 2116

## 2019-11-08 NOTE — Discharge Instructions (Addendum)
Follow-up with your regular doctor as needed.  Return emergency department for any sign of infection such as pus, redness, swelling, or increased pain.  Suture should be removed in 1 week.  Keep the area as dry as possible.  You may return to work but I would recommend wearing a bandage over the cut.  If you wear gloves they need to be leather breathable.  Plastic gloves will cause the area not to heal.

## 2019-11-08 NOTE — ED Triage Notes (Signed)
Pt states he was at work and was not paying attention when he accidentally cut his left hand- bleeding is under control at this time

## 2019-11-08 NOTE — ED Notes (Signed)
Pt is ambulatory to the room; cut his hand at work (see triage notes).  Left hand is wrapped, bleeding is controlled, patient is in no acute distress at this time.

## 2020-01-09 ENCOUNTER — Encounter: Payer: Self-pay | Admitting: Emergency Medicine

## 2020-01-09 ENCOUNTER — Other Ambulatory Visit: Payer: Self-pay

## 2020-01-09 ENCOUNTER — Emergency Department: Payer: Self-pay

## 2020-01-09 ENCOUNTER — Emergency Department
Admission: EM | Admit: 2020-01-09 | Discharge: 2020-01-09 | Disposition: A | Discharge status: Home / Self Care | Payer: Self-pay | Attending: Emergency Medicine | Admitting: Emergency Medicine

## 2020-01-09 DIAGNOSIS — R52 Pain, unspecified: Secondary | ICD-10-CM

## 2020-01-09 DIAGNOSIS — Z79899 Other long term (current) drug therapy: Secondary | ICD-10-CM | POA: Insufficient documentation

## 2020-01-09 DIAGNOSIS — I10 Essential (primary) hypertension: Secondary | ICD-10-CM | POA: Insufficient documentation

## 2020-01-09 DIAGNOSIS — M25561 Pain in right knee: Secondary | ICD-10-CM | POA: Insufficient documentation

## 2020-01-09 MED ORDER — AMLODIPINE BESYLATE 5 MG PO TABS: 5.0000 mg | ORAL_TABLET | Freq: Every day | ORAL | 1 refills | Status: DC

## 2020-01-09 MED ORDER — KETOROLAC TROMETHAMINE 30 MG/ML IJ SOLN
15.0000 mg | Freq: Once | INTRAMUSCULAR | Status: AC
  Administered 2020-01-09: 15 mg via INTRAMUSCULAR
  Filled 2020-01-09: qty 1

## 2020-01-09 MED ORDER — AMLODIPINE BESYLATE 5 MG PO TABS
5.0000 mg | ORAL_TABLET | Freq: Once | ORAL | Status: AC
  Administered 2020-01-09: 5 mg via ORAL
  Filled 2020-01-09: qty 1

## 2020-01-09 MED ORDER — TRAMADOL HCL 50 MG PO TABS: 50.0000 mg | ORAL_TABLET | Freq: Four times a day (QID) | ORAL | 0 refills | Status: AC | PRN

## 2020-01-09 NOTE — Discharge Instructions (Signed)
Use the good Rx card to pick up your prescription at the pharmacy.  It looks like it is $9 at [place] using the card.  It is about $12 at [facility].

## 2020-01-09 NOTE — ED Notes (Signed)
Pt BP elevated at the time of triage, pt admits to having hx of HTN and was on medication to fix it however, he has just started a new job and is waiting on insurance to start so that he can establish a PCP to get HTN under control.

## 2020-01-09 NOTE — ED Triage Notes (Signed)
Right inner knee pain since yesterday.

## 2020-01-09 NOTE — ED Provider Notes (Signed)
[facility] Emergency Department Provider Note  ____________________________________________  Time seen: Approximately 1:01 PM  I have reviewed the triage vital signs and the nursing notes.   HISTORY  Chief Complaint Knee Pain    HPI Dave Mcintyre is a 29 y.o. male that presents to the emergency department for evaluation of left lateral knee pain for 2 days.  Patient woke up yesterday morning and felt the outside of his knee was swollen.  He did not do any specific trauma but does work 10 hours on his feet on concrete all day and a new job.  No difficulty moving knee.  No fevers, leg swelling.  Patient states that he has a history of hypertension.  He has not been on his amlodipine in about 1 year.  He states that he just started a new job and will be getting insurance soon.  No headache, shortness breath, chest pain.  Past Medical History:  Diagnosis Date   Hypertension     There are no problems to display for this patient.   History reviewed. No pertinent surgical history.  Prior to Admission medications   Medication Sig Start Date End Date Taking? Authorizing Provider  amLODipine (NORVASC) 5 MG tablet Take 1 tablet (5 mg total) by mouth daily. 01/09/20 01/08/21  Enid Derry, PA-C  traMADol (ULTRAM) 50 MG tablet Take 1 tablet (50 mg total) by mouth every 6 (six) hours as needed. 01/09/20 01/08/21  Enid Derry, PA-C    Allergies Penicillins  No family history on file.  Social History Social History   Tobacco Use   Smoking status: Never Smoker   Smokeless tobacco: Never Used  Substance Use Topics   Alcohol use: Yes   Drug use: Not on file     Review of Systems  Constitutional: No fever/chills Cardiovascular: No chest pain. Respiratory: No SOB. Gastrointestinal: No abdominal pain.  No nausea, no vomiting.  Musculoskeletal: Positive for knee pain. Skin: Negative for rash, abrasions, lacerations, ecchymosis. Neurological:  Negative for headaches   ____________________________________________   PHYSICAL EXAM:  VITAL SIGNS: ED Triage Vitals [01/09/20 1055]  Enc Vitals Group     BP (!) 190/147     Pulse Rate (!) 101     Resp 18     Temp 98.5 F (36.9 C)     Temp Source Oral     SpO2 100 %     Weight 205 lb (93 kg)     Height 5\' 9"  (1.753 m)     Head Circumference      Peak Flow      Pain Score 8     Pain Loc      Pain Edu?      Excl. in GC?      Constitutional: Alert and oriented. Well appearing and in no acute distress. Eyes: Conjunctivae are normal. PERRL. EOMI. Head: Atraumatic. ENT:      Ears:      Nose: No congestion/rhinnorhea.      Mouth/Throat: Mucous membranes are moist.  Neck: No stridor.  Cardiovascular: Normal rate, regular rhythm.  Good peripheral circulation. Respiratory: Normal respiratory effort without tachypnea or retractions. Lungs CTAB. Good air entry to the bases with no decreased or absent breath sounds. Gastrointestinal: Bowel sounds 4 quadrants. Soft and nontender to palpation. No guarding or rigidity. No palpable masses. No distention.  Musculoskeletal: Full range of motion to all extremities. No gross deformities appreciated.  Mild tenderness to palpation to left lateral knee.  No swelling or erythema.  Full range of motion of the left knee without pain. Neurologic:  Normal speech and language. No gross focal neurologic deficits are appreciated.  Skin:  Skin is warm, dry and intact. No rash noted. Psychiatric: Mood and affect are normal. Speech and behavior are normal. Patient exhibits appropriate insight and judgement.   ____________________________________________   LABS (all labs ordered are listed, but only abnormal results are displayed)  Labs Reviewed - No data to display ____________________________________________  EKG   ____________________________________________  RADIOLOGY Lexine Baton, personally viewed and evaluated these images (plain  radiographs) as part of my medical decision making, as well as reviewing the written report by the radiologist.  US Venous Img Lower Unilateral Right (DVT)  Result Date: 01/09/2020 CLINICAL DATA:  RIGHT leg pain for 2 days EXAM: RIGHT LOWER EXTREMITY VENOUS DOPPLER ULTRASOUND TECHNIQUE: Gray-scale sonography with compression, as well as color and duplex ultrasound, were performed to evaluate the deep venous system(s) from the level of the common femoral vein through the popliteal and proximal calf veins. COMPARISON:  None FINDINGS: VENOUS Normal compressibility of the common femoral, superficial femoral, and popliteal veins, as well as the visualized calf veins. Visualized portions of profunda femoral vein and great saphenous vein unremarkable. No filling defects to suggest DVT on grayscale or color Doppler imaging. Doppler waveforms show normal direction of venous flow, normal respiratory plasticity and response to augmentation. Limited views of the contralateral common femoral vein are unremarkable. OTHER None. Limitations: none IMPRESSION: No evidence of deep venous thrombosis in the RIGHT lower extremity. Electronically Signed   By: Ulyses Southward M.D.   On: 01/09/2020 14:06   DG Knee Complete 4 Views Right  Result Date: 01/09/2020 CLINICAL DATA:  Knee pain EXAM: RIGHT KNEE - COMPLETE 4+ VIEW COMPARISON:  None. FINDINGS: Alignment is anatomic. No acute fracture. Probable small joint effusion. Joint spaces are preserved. IMPRESSION: No acute osseous abnormality.  Probable small joint effusion. Electronically Signed   By: Guadlupe Spanish M.D.   On: 01/09/2020 12:05    ____________________________________________    PROCEDURES  Procedure(s) performed:    Procedures    Medications  amLODipine (NORVASC) tablet 5 mg (5 mg Oral Given 01/09/20 1226)  ketorolac (TORADOL) 30 MG/ML injection 15 mg (15 mg Intramuscular Given 01/09/20 1437)      ____________________________________________   INITIAL IMPRESSION / ASSESSMENT AND PLAN / ED COURSE  Pertinent labs & imaging results that were available during my care of the patient were reviewed by me and considered in my medical decision making (see chart for details).  Review of the [place] CSRS was performed in accordance of the [facility] prior to dispensing any controlled drugs.   Patient presented to the emergency department for evaluation of knee pain for 2 days.  Vital signs and exam are reassuring.  X-ray negative for acute abnormalities and does show a probable small joint effusion.  Ultrasound negative for DVT.  Patient's blood pressure is elevated in the emergency department.  He has a history of hypertension and has not been on his medications.  He was given a dose of his amlodipine in the emergency department.  He will be given a couple month prescription for his amlodipine and references for primary care was provided.  He denies any headache, shortness of breath, chest pain.  Patient was educated on the risks of hypertension, including stroke and heart attack.  Patient will be discharged home with prescriptions for amlodipine and a short course of tramadol. Patient is to follow up with PCP  as directed. Patient is given ED precautions to return to the ED for any worsening or new symptoms.   KASSIDY FRANKSON was evaluated in Emergency Department on 01/09/2020 for the symptoms described in the history of present illness. He was evaluated in the context of the global COVID-19 pandemic, which necessitated consideration that the patient might be at risk for infection with the SARS-CoV-2 virus that causes COVID-19. Institutional protocols and algorithms that pertain to the evaluation of patients at risk for COVID-19 are in a state of rapid change based on information released by regulatory bodies including the CDC and federal and state organizations. These policies and algorithms were followed  during the patient's care in the ED.  ____________________________________________  FINAL CLINICAL IMPRESSION(S) / ED DIAGNOSES  Final diagnoses:  Acute pain of right knee  Hypertension, unspecified type      NEW MEDICATIONS STARTED DURING THIS VISIT:  ED Discharge Orders         Ordered    amLODipine (NORVASC) 5 MG tablet  Daily,   Status:  Discontinued        01/09/20 1426    amLODipine (NORVASC) 5 MG tablet  Daily        01/09/20 1426    traMADol (ULTRAM) 50 MG tablet  Every 6 hours PRN        01/09/20 1427              This chart was dictated using voice recognition software/Dragon. Despite best efforts to proofread, errors can occur which can change the meaning. Any change was purely unintentional.    Enid Derry, PA-C 01/09/20 1844    Minna Antis, MD 01/10/20 1023

## 2020-01-09 NOTE — ED Notes (Signed)
Pt states he has HTN but doesn't taken medicine because he's "waiting on insurance and his doctor".

## 2021-02-10 ENCOUNTER — Ambulatory Visit: Payer: Self-pay | Admitting: Internal Medicine

## 2021-02-18 ENCOUNTER — Ambulatory Visit: Payer: Managed Care, Other (non HMO) | Admitting: Internal Medicine

## 2021-04-04 ENCOUNTER — Encounter: Payer: Self-pay | Admitting: Internal Medicine

## 2021-04-04 ENCOUNTER — Other Ambulatory Visit: Payer: Self-pay

## 2021-04-04 ENCOUNTER — Ambulatory Visit (INDEPENDENT_AMBULATORY_CARE_PROVIDER_SITE_OTHER): Payer: 59 | Admitting: Internal Medicine

## 2021-04-04 VITALS — BP 197/111 | HR 116 | Ht 72.0 in | Wt 213.2 lb

## 2021-04-04 DIAGNOSIS — Z114 Encounter for screening for human immunodeficiency virus [HIV]: Secondary | ICD-10-CM | POA: Insufficient documentation

## 2021-04-04 DIAGNOSIS — F419 Anxiety disorder, unspecified: Secondary | ICD-10-CM | POA: Diagnosis not present

## 2021-04-04 DIAGNOSIS — Z1159 Encounter for screening for other viral diseases: Secondary | ICD-10-CM | POA: Diagnosis not present

## 2021-04-04 DIAGNOSIS — I10 Essential (primary) hypertension: Secondary | ICD-10-CM | POA: Insufficient documentation

## 2021-04-04 MED ORDER — LOSARTAN POTASSIUM-HCTZ 50-12.5 MG PO TABS: 1.0000 | ORAL_TABLET | Freq: Every day | ORAL | 2 refills | Status: AC

## 2021-04-04 MED ORDER — CITALOPRAM HYDROBROMIDE 10 MG PO TABS: 10.0000 mg | ORAL_TABLET | Freq: Every day | ORAL | 3 refills | Status: AC

## 2021-04-04 NOTE — Assessment & Plan Note (Signed)
-   Patient experiencing high levels of anxiety.  - Encouraged patient to engage in relaxing activities like yoga, meditation, journaling, going for a walk, or participating in a hobby.  - Encouraged patient to reach out to trusted friends or family members about recent struggles, Patient was advised to read A book, how to stop worrying and start living, it is good book to read to control  the stress  

## 2021-04-04 NOTE — Assessment & Plan Note (Signed)
Blood pressure is high I started the patient on losartan

## 2021-04-04 NOTE — Progress Notes (Signed)
New Patient Office Visit  Subjective:  Patient ID: Dave Mcintyre, male    DOB: [birth year]  Age: 31 y.o. MRN: [id number]  CC:  Chief Complaint  Patient presents with   New Patient (Initial Visit)    Patient has been out of bp meds for roughly 6 months. Patient's bp is elevated in office today.     Hypertension This is a chronic problem. The current episode started more than 1 year ago. The problem has been waxing and waning since onset. Associated symptoms include anxiety and sweats. Pertinent negatives include no chest pain, headaches, neck pain, orthopnea, palpitations, peripheral edema or shortness of breath. There are no known risk factors for coronary artery disease. There is no history of PVD. Identifiable causes of hypertension include sleep apnea. There is no history of chronic renal disease.  Anxiety Presents for initial visit. Onset was 1 to 6 months ago. The problem has been waxing and waning. Symptoms include insomnia, irritability and panic. Patient reports no chest pain, depressed mood, muscle tension, nausea, palpitations or shortness of breath.    Patient presents for hyperension  Past Medical History:  Diagnosis Date   Anxiety    Depression    Hypertension      Current Outpatient Medications:    citalopram (CELEXA) 10 MG tablet, Take 1 tablet (10 mg total) by mouth daily., Disp: 30 tablet, Rfl: 3   losartan-hydrochlorothiazide (HYZAAR) 50-12.5 MG tablet, Take 1 tablet by mouth daily., Disp: 90 tablet, Rfl: 2   History reviewed. No pertinent surgical history.  Family History  Family history unknown: Yes    Social History   Socioeconomic History   Marital status: Single    Spouse name: Not on file   Number of children: Not on file   Years of education: 12   Highest education level: 12th grade  Occupational History   Not on file  Tobacco Use   Smoking status: Never   Smokeless tobacco: Never  Substance and Sexual Activity   Alcohol use: Yes   Drug  use: Never   Sexual activity: Yes    Birth control/protection: None  Other Topics Concern   Not on file  Social History Narrative   Not on file   Social Determinants of Health   Financial Resource Strain: Not on file  Food Insecurity: Not on file  Transportation Needs: Not on file  Physical Activity: Not on file  Stress: Not on file  Social Connections: Not on file  Intimate Partner Violence: Not on file    ROS Review of Systems  Constitutional:  Positive for irritability.  HENT: Negative.    Eyes: Negative.   Respiratory: Negative.  Negative for shortness of breath.   Cardiovascular: Negative.  Negative for chest pain, palpitations and orthopnea.  Gastrointestinal: Negative.  Negative for nausea.  Endocrine: Negative.   Genitourinary: Negative.   Musculoskeletal: Negative.  Negative for neck pain.  Skin: Negative.   Allergic/Immunologic: Negative.   Neurological: Negative.  Negative for headaches.  Hematological: Negative.   Psychiatric/Behavioral:  The patient has insomnia.   All other systems reviewed and are negative.  Objective:   Today's Vitals: BP (!) 197/111    Pulse (!) 116    Ht 6' (1.829 m)    Wt 213 lb 3.2 oz (96.7 kg)    BMI 28.92 kg/m   Physical Exam Constitutional:      Appearance: Normal appearance.  HENT:     Head: Normocephalic and atraumatic.     Nose: Nose  normal.     Mouth/Throat:     Mouth: Mucous membranes are moist.  Eyes:     Pupils: Pupils are equal, round, and reactive to light.  Cardiovascular:     Rate and Rhythm: Normal rate.  Abdominal:     General: Abdomen is flat.     Tenderness: There is no guarding.  Musculoskeletal:        General: No swelling.  Skin:    Coloration: Skin is not jaundiced.  Neurological:     General: No focal deficit present.     Mental Status: He is alert.  Psychiatric:        Mood and Affect: Mood normal.        Behavior: Behavior normal.    Assessment & Plan:  Report of the  electrocardiogram.  Sinus tachycardia no acute changes are noted   Problem List Items Addressed This Visit       Cardiovascular and Mediastinum   Primary hypertension - Primary    Blood pressure is high I started the patient on losartan      Relevant Medications   losartan-hydrochlorothiazide (HYZAAR) 50-12.5 MG tablet   Other Relevant Orders   EKG 12-Lead   CBC w/Diff/Platelet   Lipid Profile   TSH   COMPLETE METABOLIC PANEL WITH GFR     Other   Anxiety    - Patient experiencing high levels of anxiety.  - Encouraged patient to engage in relaxing activities like yoga, meditation, journaling, going for a walk, or participating in a hobby.  - Encouraged patient to reach out to trusted friends or family members about recent struggles, Patient was advised to read A book, how to stop worrying and start living, it is good book to read to control  the stress       Relevant Medications   citalopram (CELEXA) 10 MG tablet   Need for hepatitis C screening test   Relevant Orders   Hepatitis C Antibody   Encounter for screening for HIV   Relevant Orders   HIV antibody (with reflex)    Outpatient Encounter Medications as of 04/04/2021  Medication Sig   citalopram (CELEXA) 10 MG tablet Take 1 tablet (10 mg total) by mouth daily.   losartan-hydrochlorothiazide (HYZAAR) 50-12.5 MG tablet Take 1 tablet by mouth daily.   [DISCONTINUED] amLODipine (NORVASC) 5 MG tablet Take 5 mg by mouth daily.   [DISCONTINUED] amLODipine (NORVASC) 5 MG tablet Take 1 tablet (5 mg total) by mouth daily.   No facility-administered encounter medications on file as of 04/04/2021.    Follow-up: No follow-ups on file.   Corky Downs, MD

## 2021-04-05 LAB — COMPLETE METABOLIC PANEL WITH GFR
AG Ratio: 1.6 (calc) (ref 1.0–2.5)
ALT: 26 U/L (ref 9–46)
AST: 21 U/L (ref 10–40)
Albumin: 4.6 g/dL (ref 3.6–5.1)
Alkaline phosphatase (APISO): 76 U/L (ref 36–130)
BUN: 12 mg/dL (ref 7–25)
CO2: 27 mmol/L (ref 20–32)
Calcium: 9.5 mg/dL (ref 8.6–10.3)
Chloride: 102 mmol/L (ref 98–110)
Creat: 0.83 mg/dL (ref 0.60–1.26)
Globulin: 2.9 g/dL (calc) (ref 1.9–3.7)
Glucose, Bld: 90 mg/dL (ref 65–99)
Potassium: 3.6 mmol/L (ref 3.5–5.3)
Sodium: 139 mmol/L (ref 135–146)
Total Bilirubin: 0.7 mg/dL (ref 0.2–1.2)
Total Protein: 7.5 g/dL (ref 6.1–8.1)
eGFR: 121 mL/min/{1.73_m2} (ref >=60)

## 2021-04-05 LAB — HEPATITIS C ANTIBODY
Hepatitis C Ab: NONREACTIVE
SIGNAL TO CUT-OFF: 0.03 (ref <1.00)

## 2021-04-05 LAB — TSH: TSH: 1.93 mIU/L (ref 0.40–4.50)

## 2021-04-05 LAB — CBC WITH DIFFERENTIAL/PLATELET
Absolute Monocytes: 632 cells/uL (ref 200–950)
Basophils Absolute: 41 cells/uL (ref 0–200)
Basophils Relative: 0.5 %
Eosinophils Absolute: 73 cells/uL (ref 15–500)
Eosinophils Relative: 0.9 %
HCT: 47.8 % (ref 38.5–50.0)
Hemoglobin: 16.7 g/dL (ref 13.2–17.1)
Lymphs Abs: 3094 cells/uL (ref 850–3900)
MCH: 33.1 pg — ABNORMAL HIGH (ref 27.0–33.0)
MCHC: 34.9 g/dL (ref 32.0–36.0)
MCV: 94.8 fL (ref 80.0–100.0)
MPV: 10.9 fL (ref 7.5–12.5)
Monocytes Relative: 7.8 %
Neutro Abs: 4261 cells/uL (ref 1500–7800)
Neutrophils Relative %: 52.6 %
Platelets: 278 10*3/uL (ref 140–400)
RBC: 5.04 10*6/uL (ref 4.20–5.80)
RDW: 12.4 % (ref 11.0–15.0)
Total Lymphocyte: 38.2 %
WBC: 8.1 10*3/uL (ref 3.8–10.8)

## 2021-04-05 LAB — LIPID PANEL
Cholesterol: 304 mg/dL — ABNORMAL HIGH (ref <200)
HDL: 59 mg/dL (ref >=40)
LDL Cholesterol (Calc): 210 mg/dL (calc) — ABNORMAL HIGH
Non-HDL Cholesterol (Calc): 245 mg/dL (calc) — ABNORMAL HIGH (ref <130)
Total CHOL/HDL Ratio: 5.2 (calc) — ABNORMAL HIGH (ref <5.0)
Triglycerides: 181 mg/dL — ABNORMAL HIGH (ref <150)

## 2021-04-05 LAB — HIV ANTIBODY (ROUTINE TESTING W REFLEX): HIV 1&2 Ab, 4th Generation: NONREACTIVE

## 2021-04-19 ENCOUNTER — Other Ambulatory Visit: Payer: Self-pay

## 2021-04-19 ENCOUNTER — Ambulatory Visit (INDEPENDENT_AMBULATORY_CARE_PROVIDER_SITE_OTHER): Payer: No Typology Code available for payment source | Admitting: Internal Medicine

## 2021-04-19 ENCOUNTER — Encounter: Payer: Self-pay | Admitting: Internal Medicine

## 2021-04-19 VITALS — BP 160/100 | HR 122 | Ht 72.0 in | Wt 213.3 lb

## 2021-04-19 DIAGNOSIS — F419 Anxiety disorder, unspecified: Secondary | ICD-10-CM | POA: Diagnosis not present

## 2021-04-19 DIAGNOSIS — E7800 Pure hypercholesterolemia, unspecified: Secondary | ICD-10-CM | POA: Diagnosis not present

## 2021-04-19 DIAGNOSIS — I10 Essential (primary) hypertension: Secondary | ICD-10-CM | POA: Diagnosis not present

## 2021-04-19 MED ORDER — AMLODIPINE BESYLATE 5 MG PO TABS: 5.0000 mg | ORAL_TABLET | Freq: Every day | ORAL | 3 refills | Status: AC

## 2021-04-19 MED ORDER — SIMVASTATIN 20 MG PO TABS: 20.0000 mg | ORAL_TABLET | Freq: Every day | ORAL | 3 refills | Status: AC

## 2021-04-19 NOTE — Assessment & Plan Note (Signed)

## 2021-04-19 NOTE — Assessment & Plan Note (Signed)
-   Patient experiencing high levels of anxiety.  - Encouraged patient to engage in relaxing activities like yoga, meditation, journaling, going for a walk, or participating in a hobby.  - Encouraged patient to reach out to trusted friends or family members about recent struggles, Patient was advised to read A book, how to stop worrying and start living, it is good book to read to control  the stress  

## 2021-04-19 NOTE — Progress Notes (Signed)
Established Patient Office Visit  Subjective:  Patient ID: Dave Mcintyre, male    DOB: 12-01-1990  Age: 31 y.o. MRN: [id number]  CC:  Chief Complaint  Patient presents with   Hypertension    Follow up    Lab Results    Hypertension This is a new problem. The current episode started 1 to 4 weeks ago. The problem has been gradually improving since onset. The problem is uncontrolled. Pertinent negatives include no anxiety, chest pain, orthopnea or shortness of breath. There are no associated agents to hypertension.   Dave Mcintyre presents for bp check  Past Medical History:  Diagnosis Date   Anxiety    Depression    Hypertension     No past surgical history on file.  Family History  Family history unknown: Yes    Social History   Socioeconomic History   Marital status: Single    Spouse name: Not on file   Number of children: Not on file   Years of education: 12   Highest education level: 12th grade  Occupational History   Not on file  Tobacco Use   Smoking status: Every Day    Types: Cigarettes   Smokeless tobacco: Never  Substance and Sexual Activity   Alcohol use: Yes   Drug use: Never   Sexual activity: Yes    Birth control/protection: None  Other Topics Concern   Not on file  Social History Narrative   Not on file   Social Determinants of Health   Financial Resource Strain: Not on file  Food Insecurity: Not on file  Transportation Needs: Not on file  Physical Activity: Not on file  Stress: Not on file  Social Connections: Not on file  Intimate Partner Violence: Not on file     Current Outpatient Medications:    citalopram (CELEXA) 10 MG tablet, Take 1 tablet (10 mg total) by mouth daily., Disp: 30 tablet, Rfl: 3   losartan-hydrochlorothiazide (HYZAAR) 50-12.5 MG tablet, Take 1 tablet by mouth daily., Disp: 90 tablet, Rfl: 2   Allergies  Allergen Reactions   Penicillins     ROS Review of Systems  Constitutional: Negative.   HENT:  Negative.    Eyes: Negative.   Respiratory: Negative.  Negative for shortness of breath.   Cardiovascular: Negative.  Negative for chest pain and orthopnea.  Gastrointestinal: Negative.   Endocrine: Negative.   Genitourinary: Negative.   Musculoskeletal: Negative.   Skin: Negative.   Allergic/Immunologic: Negative.   Neurological: Negative.   Hematological: Negative.   Psychiatric/Behavioral: Negative.    All other systems reviewed and are negative.    Objective:    Physical Exam Vitals reviewed.  Constitutional:      Appearance: Normal appearance.  HENT:     Mouth/Throat:     Mouth: Mucous membranes are moist.  Eyes:     Pupils: Pupils are equal, round, and reactive to light.  Neck:     Vascular: No carotid bruit.  Cardiovascular:     Rate and Rhythm: Normal rate and regular rhythm.     Pulses: Normal pulses.     Heart sounds: Normal heart sounds.  Pulmonary:     Effort: Pulmonary effort is normal.     Breath sounds: Normal breath sounds.  Abdominal:     General: Bowel sounds are normal.     Palpations: Abdomen is soft. There is no hepatomegaly, splenomegaly or mass.     Tenderness: There is no abdominal tenderness.  Hernia: No hernia is present.  Musculoskeletal:     Cervical back: Neck supple.     Right lower leg: No edema.     Left lower leg: No edema.  Skin:    Findings: No rash.  Neurological:     Mental Status: He is alert and oriented to person, place, and time.     Motor: No weakness.  Psychiatric:        Mood and Affect: Mood normal.        Behavior: Behavior normal.    BP (!) 200/117    Pulse (!) 122    Ht 6' (1.829 m)    Wt 213 lb 4.8 oz (96.8 kg)    BMI 28.93 kg/m  Wt Readings from Last 3 Encounters:  04/19/21 213 lb 4.8 oz (96.8 kg)  04/04/21 213 lb 3.2 oz (96.7 kg)  01/09/20 205 lb (93 kg)     Health Maintenance Due  Topic Date Due   COVID-19 Vaccine (1) Never done   INFLUENZA VACCINE  Never done    There are no preventive care  reminders to display for this patient.  Lab Results  Component Value Date   TSH 1.93 04/04/2021   Lab Results  Component Value Date   WBC 8.1 04/04/2021   HGB 16.7 04/04/2021   HCT 47.8 04/04/2021   MCV 94.8 04/04/2021   PLT 278 04/04/2021   Lab Results  Component Value Date   NA 139 04/04/2021   K 3.6 04/04/2021   CO2 27 04/04/2021   GLUCOSE 90 04/04/2021   BUN 12 04/04/2021   CREATININE 0.83 04/04/2021   BILITOT 0.7 04/04/2021   ALKPHOS 113 08/18/2018   AST 21 04/04/2021   ALT 26 04/04/2021   PROT 7.5 04/04/2021   ALBUMIN 4.1 08/18/2018   CALCIUM 9.5 04/04/2021   ANIONGAP 10 08/18/2018   EGFR 121 04/04/2021   Lab Results  Component Value Date   CHOL 304 (H) 04/04/2021   Lab Results  Component Value Date   HDL 59 04/04/2021   Lab Results  Component Value Date   LDLCALC 210 (H) 04/04/2021   Lab Results  Component Value Date   TRIG 181 (H) 04/04/2021   Lab Results  Component Value Date   CHOLHDL 5.2 (H) 04/04/2021   No results found for: HGBA1C    Assessment & Plan:   Problem List Items Addressed This Visit   None   No orders of the defined types were placed in this encounter.   Follow-up: No follow-ups on file.    Cletis Athens, MD

## 2021-04-19 NOTE — Assessment & Plan Note (Signed)
Hypercholesterolemia  I advised the patient to follow Mediterranean diet This diet is rich in fruits vegetables and whole grain, and This diet is also rich in fish and lean meat Patient should also eat a handful of almonds or walnuts daily Recent heart study indicated that average follow-up on this kind of diet reduces the cardiovascular mortality by 50 to 70%== 

## 2021-06-07 ENCOUNTER — Other Ambulatory Visit: Payer: Self-pay

## 2021-06-07 ENCOUNTER — Ambulatory Visit (INDEPENDENT_AMBULATORY_CARE_PROVIDER_SITE_OTHER): Payer: No Typology Code available for payment source | Admitting: Internal Medicine

## 2021-06-07 ENCOUNTER — Encounter: Payer: Self-pay | Admitting: Internal Medicine

## 2021-06-07 VITALS — BP 150/100 | HR 144 | Ht 72.0 in | Wt 213.4 lb

## 2021-06-07 DIAGNOSIS — F419 Anxiety disorder, unspecified: Secondary | ICD-10-CM | POA: Diagnosis not present

## 2021-06-07 DIAGNOSIS — E7800 Pure hypercholesterolemia, unspecified: Secondary | ICD-10-CM | POA: Diagnosis not present

## 2021-06-07 DIAGNOSIS — I10 Essential (primary) hypertension: Secondary | ICD-10-CM

## 2021-06-07 DIAGNOSIS — Z1159 Encounter for screening for other viral diseases: Secondary | ICD-10-CM

## 2021-06-07 MED ORDER — METOPROLOL SUCCINATE ER 50 MG PO TB24: 50.0000 mg | ORAL_TABLET | Freq: Every day | ORAL | 3 refills | Status: AC

## 2021-06-07 NOTE — Assessment & Plan Note (Signed)
-   Patient experiencing high levels of anxiety.  - Encouraged patient to engage in relaxing activities like yoga, meditation, journaling, going for a walk, or participating in a hobby.  - Encouraged patient to reach out to trusted friends or family members about recent struggles, Patient was advised to read A book, how to stop worrying and start living, it is good book to read to control  the stress  

## 2021-06-07 NOTE — Assessment & Plan Note (Signed)
Patient is on statin.

## 2021-06-07 NOTE — Assessment & Plan Note (Signed)
Patient is negative for hepatitis C.

## 2021-06-07 NOTE — Progress Notes (Signed)
? ?Established Patient Office Visit ? ?Subjective:  ?Patient ID: Dave Mcintyre, male    DOB: Nov 07, 1990  Age: 31 y.o. MRN: [id number] ? ?CC:  ?Chief Complaint  ?Patient presents with  ? Hypertension  ? ? ?Hypertension ? ? ?Dave Mcintyre presents for bp check up ?Past Medical History:  ?Diagnosis Date  ? Anxiety   ? Depression   ? Hypertension   ? ? ?History reviewed. No pertinent surgical history. ? ?Family History  ?Family history unknown: Yes  ? ? ?Social History  ? ?Socioeconomic History  ? Marital status: Single  ?  Spouse name: Not on file  ? Number of children: Not on file  ? Years of education: 60  ? Highest education level: 12th grade  ?Occupational History  ? Not on file  ?Tobacco Use  ? Smoking status: Every Day  ?  Types: Cigarettes  ? Smokeless tobacco: Never  ?Substance and Sexual Activity  ? Alcohol use: Yes  ? Drug use: Never  ? Sexual activity: Yes  ?  Birth control/protection: None  ?Other Topics Concern  ? Not on file  ?Social History Narrative  ? Not on file  ? ?Social Determinants of Health  ? ?Financial Resource Strain: Not on file  ?Food Insecurity: Not on file  ?Transportation Needs: Not on file  ?Physical Activity: Not on file  ?Stress: Not on file  ?Social Connections: Not on file  ?Intimate Partner Violence: Not on file  ? ? ? ?Current Outpatient Medications:  ?  amLODipine (NORVASC) 5 MG tablet, Take 1 tablet (5 mg total) by mouth daily., Disp: 90 tablet, Rfl: 3 ?  citalopram (CELEXA) 10 MG tablet, Take 1 tablet (10 mg total) by mouth daily., Disp: 30 tablet, Rfl: 3 ?  losartan-hydrochlorothiazide (HYZAAR) 50-12.5 MG tablet, Take 1 tablet by mouth daily., Disp: 90 tablet, Rfl: 2 ?  simvastatin (ZOCOR) 20 MG tablet, Take 1 tablet (20 mg total) by mouth at bedtime., Disp: 90 tablet, Rfl: 3  ? ?Allergies  ?Allergen Reactions  ? Penicillins   ? ? ?ROS ?Review of Systems  ?Constitutional: Negative.   ?HENT: Negative.    ?Eyes: Negative.   ?Respiratory: Negative.    ?Cardiovascular:  Negative.   ?Gastrointestinal: Negative.   ?Endocrine: Negative.   ?Genitourinary: Negative.   ?Musculoskeletal: Negative.   ?Skin: Negative.   ?Allergic/Immunologic: Negative.   ?Neurological: Negative.   ?Hematological: Negative.   ?Psychiatric/Behavioral: Negative.    ?All other systems reviewed and are negative. ? ?  ?Objective:  ?  ?Physical Exam ?Vitals reviewed.  ?Constitutional:   ?   Appearance: Normal appearance.  ?HENT:  ?   Mouth/Throat:  ?   Mouth: Mucous membranes are moist.  ?Eyes:  ?   Pupils: Pupils are equal, round, and reactive to light.  ?Neck:  ?   Vascular: No carotid bruit.  ?Cardiovascular:  ?   Rate and Rhythm: Normal rate and regular rhythm.  ?   Pulses: Normal pulses.  ?   Heart sounds: Normal heart sounds.  ?Pulmonary:  ?   Effort: Pulmonary effort is normal.  ?   Breath sounds: Normal breath sounds.  ?Abdominal:  ?   General: Bowel sounds are normal.  ?   Palpations: Abdomen is soft. There is no hepatomegaly, splenomegaly or mass.  ?   Tenderness: There is no abdominal tenderness.  ?   Hernia: No hernia is present.  ?Musculoskeletal:  ?   Cervical back: Neck supple.  ?   Right lower leg: No  edema.  ?   Left lower leg: No edema.  ?Skin: ?   Findings: No rash.  ?Neurological:  ?   Mental Status: He is alert and oriented to person, place, and time.  ?   Motor: No weakness.  ?Psychiatric:     ?   Mood and Affect: Mood normal.     ?   Behavior: Behavior normal.  ? ? ?BP (!) 150/100   Pulse (!) 144   Ht 6' (1.829 m)   Wt 213 lb 6.4 oz (96.8 kg)   BMI 28.94 kg/m?  ?Wt Readings from Last 3 Encounters:  ?06/07/21 213 lb 6.4 oz (96.8 kg)  ?04/19/21 213 lb 4.8 oz (96.8 kg)  ?04/04/21 213 lb 3.2 oz (96.7 kg)  ? ? ? ?Health Maintenance Due  ?Topic Date Due  ? COVID-19 Vaccine (1) Never done  ? INFLUENZA VACCINE  Never done  ? ? ?There are no preventive care reminders to display for this patient. ? ?Lab Results  ?Component Value Date  ? TSH 1.93 04/04/2021  ? ?Lab Results  ?Component Value Date  ?  WBC 8.1 04/04/2021  ? HGB 16.7 04/04/2021  ? HCT 47.8 04/04/2021  ? MCV 94.8 04/04/2021  ? PLT 278 04/04/2021  ? ?Lab Results  ?Component Value Date  ? NA 139 04/04/2021  ? K 3.6 04/04/2021  ? CO2 27 04/04/2021  ? GLUCOSE 90 04/04/2021  ? BUN 12 04/04/2021  ? CREATININE 0.83 04/04/2021  ? BILITOT 0.7 04/04/2021  ? ALKPHOS 113 08/18/2018  ? AST 21 04/04/2021  ? ALT 26 04/04/2021  ? PROT 7.5 04/04/2021  ? ALBUMIN 4.1 08/18/2018  ? CALCIUM 9.5 04/04/2021  ? ANIONGAP 10 08/18/2018  ? EGFR 121 04/04/2021  ? ?Lab Results  ?Component Value Date  ? CHOL 304 (H) 04/04/2021  ? ?Lab Results  ?Component Value Date  ? HDL 59 04/04/2021  ? ?Lab Results  ?Component Value Date  ? LDLCALC 210 (H) 04/04/2021  ? ?Lab Results  ?Component Value Date  ? TRIG 181 (H) 04/04/2021  ? ?Lab Results  ?Component Value Date  ? CHOLHDL 5.2 (H) 04/04/2021  ? ?No results found for: HGBA1C ? ?  ?Assessment & Plan:  ? ?Problem List Items Addressed This Visit   ? ?  ? Cardiovascular and Mediastinum  ? Primary hypertension - Primary  ?   Patient denies any chest pain or shortness of breath there is no history of palpitation or paroxysmal nocturnal dyspnea ?  patient was advised to follow low-salt low-cholesterol diet ? ?  ideally I want to keep systolic blood pressure below 130 mmHg, patient was asked to check blood pressure one times a week and give me a report on that.  Patient will be follow-up in 3 months  or earlier as needed, patient will call me back for any change in the cardiovascular symptoms ?Patient was advised to buy a book from local bookstore concerning blood pressure and read several chapters  every day.  This will be supplemented by some of the material we will give him from the office.  Patient should also utilize other resources like YouTube and Internet to learn more about the blood pressure and the diet. ?  ?  ?  ? Other  ? Anxiety  ?  - Patient experiencing high levels of anxiety.  ?- Encouraged patient to engage in relaxing  activities like yoga, meditation, journaling, going for a walk, or participating in a hobby.  ?- Encouraged patient to reach  out to trusted friends or family members about recent struggles, ?Patient was advised to read A book, how to stop worrying and start living, it is good book to read to control  the stress ? ?  ?  ? Need for hepatitis C screening test  ?  Patient is negative for hepatitis C ?  ?  ? Pure hypercholesterolemia  ?  Patient is on statin ?  ?  ? ? ?No orders of the defined types were placed in this encounter. ? ? ?Follow-up: No follow-ups on file.  ? ? ?Cletis Athens, MD ?

## 2021-06-07 NOTE — Assessment & Plan Note (Signed)

## 2022-01-07 ENCOUNTER — Other Ambulatory Visit: Payer: Self-pay

## 2022-01-07 ENCOUNTER — Emergency Department
Admission: EM | Admit: 2022-01-07 | Discharge: 2022-01-07 | Disposition: A | Discharge status: Home / Self Care | Payer: No Typology Code available for payment source | Attending: Emergency Medicine | Admitting: Emergency Medicine

## 2022-01-07 DIAGNOSIS — F419 Anxiety disorder, unspecified: Secondary | ICD-10-CM | POA: Diagnosis present

## 2022-01-07 DIAGNOSIS — I10 Essential (primary) hypertension: Secondary | ICD-10-CM | POA: Diagnosis not present

## 2022-01-07 MED ORDER — HYDROXYZINE HCL 25 MG PO TABS: 25.0000 mg | ORAL_TABLET | Freq: Two times a day (BID) | ORAL | 0 refills | Status: AC | PRN

## 2022-01-07 NOTE — ED Triage Notes (Signed)
Pt reports repeated panic attacks for the last 3 days with chest pain. Denies chest pain currently. Reports recently ran out of anxiety medication at home. Pt anxious in triage but calm and cooperative. Breathing unlabored with symmetric chest rise and fall. Denies h/a, dizziness, vision change, parasthesia. HTN noted with hx of same. Reports compliant with meds at home

## 2022-01-07 NOTE — ED Provider Notes (Addendum)
[facility] Provider Note    Event Date/Time   First MD Initiated Contact with Patient 01/07/22 0249     (approximate)   History   Anxiety   HPI  Dave Mcintyre is a 31 y.o. male   Past medical history of tension anxiety who presents to the emergency department today with increasing anxiety over the past few months.  He has panic attacks at times.  Currently feeling anxious, not suicidal, no other complaints.    life stressors have been increasing over this past year.  Does not have a primary care doctor.  He was prescribed citalopram for anxiety back in January but stopped taking it because he felt it did not work for him.  He has not been on any anxiety medicines for many months now.  Occasional marijuana use.  Occasional drinker.  No other drug use.   History was obtained via patient. Independent historian includes his partner who is at bedside for collateral information      Physical Exam   Triage Vital Signs: ED Triage Vitals  Enc Vitals Group     BP 01/07/22 0034 (!) 156/114     Pulse Rate 01/07/22 0034 (!) 101     Resp 01/07/22 0034 18     Temp 01/07/22 0034 98.5 F (36.9 C)     Temp Source 01/07/22 0034 Oral     SpO2 01/07/22 0034 100 %     Weight 01/07/22 0035 214 lb (97.1 kg)     Height 01/07/22 0035 6\' 1"  (1.854 m)     Head Circumference --      Peak Flow --      Pain Score 01/07/22 0035 0     Pain Loc --      Pain Edu? --      Excl. in [place]? --     Most recent vital signs: Vitals:   01/07/22 0034  BP: (!) 156/114  Pulse: (!) 101  Resp: 18  Temp: 98.5 F (36.9 C)  SpO2: 100%    General: Awake, no distress.  CV:  Good peripheral perfusion.  Resp:  Normal effort.  Abd:  No distention.  Other:  Well-appearing, calm, cooperative and pleasant.  Does not appear toxidrome medic or an acute withdrawal.  He was mildly tachycardic in triage but rate normal on my examination.   ED Results / Procedures / Treatments    Labs (all labs ordered are listed, but only abnormal results are displayed) Labs Reviewed - No data to display   EKG  ED ECG REPORT I, Lucillie Garfinkel, the attending physician, personally viewed and interpreted this ECG.   Date: 01/07/2022  EKG Time: 0309  Rate: 94  Rhythm: normal EKG, normal sinus rhythm, incomplete rbbb  Axis: nl  Intervals:none  ST&T Change: no ischemic changes   PROCEDURES:  Critical Care performed: No  Procedures   MEDICATIONS ORDERED IN ED: Medications - No data to display  IMPRESSION / MDM / [place] / ED COURSE  I reviewed the triage vital signs and the nursing notes.                              Differential diagnosis includes, but is not limited to generalized anxiety, considered but less likely dysrhythmia, intoxication, withdrawal, acs    MDM: Overall well-appearing patient with no acute complaints but increasing anxiety and no primary doctor can prescribe anxiety medications.  He is  requesting a referral to a primary doctor and some anxiety medications to tide him over until he is able to get his appointment, prescribed Atarax, and I explained to him long-term prescriptions best handled by outpatient doctor and gave him phone numbers to call to establish care.  I gave him return precautions. HTN here without symptoms and iso anxiety and did not take antiHTN meds today. Advised to take HTN meds and f/u with primary for HTN   Patient's presentation is most consistent with severe exacerbation of chronic illness.       FINAL CLINICAL IMPRESSION(S) / ED DIAGNOSES   Final diagnoses:  Anxiety  Uncontrolled hypertension     Rx / DC Orders   ED Discharge Orders          Ordered    hydrOXYzine (ATARAX) 25 MG tablet  2 times daily PRN        01/07/22 0257             Note:  This document was prepared using Dragon voice recognition software and may include unintentional dictation errors.    Pilar Jarvis, MD 01/07/22  Donnal Debar    Pilar Jarvis, MD 01/07/22 [phone]

## 2022-01-07 NOTE — ED Notes (Signed)
ED Provider at bedside. 

## 2022-01-07 NOTE — Discharge Instructions (Signed)
   Thank you for choosing us for your health care today!  Please see your primary doctor this week for a follow up appointment.   If you do not have a primary doctor call the following clinics to establish care:  If you have insurance:  [facility] [phone] [address] [place] [address]   [facility]  [phone] [address] [place] [address]   If you do not have insurance:  [facility]  [phone] [address] [place] [address]  Sometimes, in the early stages of certain disease courses it is difficult to detect in the emergency department evaluation -- so, it is important that you continue to monitor your symptoms and call your doctor right away or return to the emergency department if you develop any new or worsening symptoms.  It was my pleasure to care for you today.   Treshon Stannard S. Maurice Fotheringham, MD  

## 2022-01-07 NOTE — ED Notes (Signed)
Patient discharged at this time. Ambulated to lobby with independent and steady gait. Breathing unlabored speaking in full sentences. Verbalized understanding of all discharge, follow up, and medication teaching. Discharged homed with all belongings.   

## 2022-01-10 IMAGING — DX DG FINGER THUMB 2+V*L*
3 series · 3 of 3 positions shown · non-contrast
Comparison: None.

CLINICAL DATA: Laceration to thenar eminence of left hand. Patient
states that he accidentally cut his left hand at work today.

EXAM:
LEFT THUMB 2+V

[finger ap]
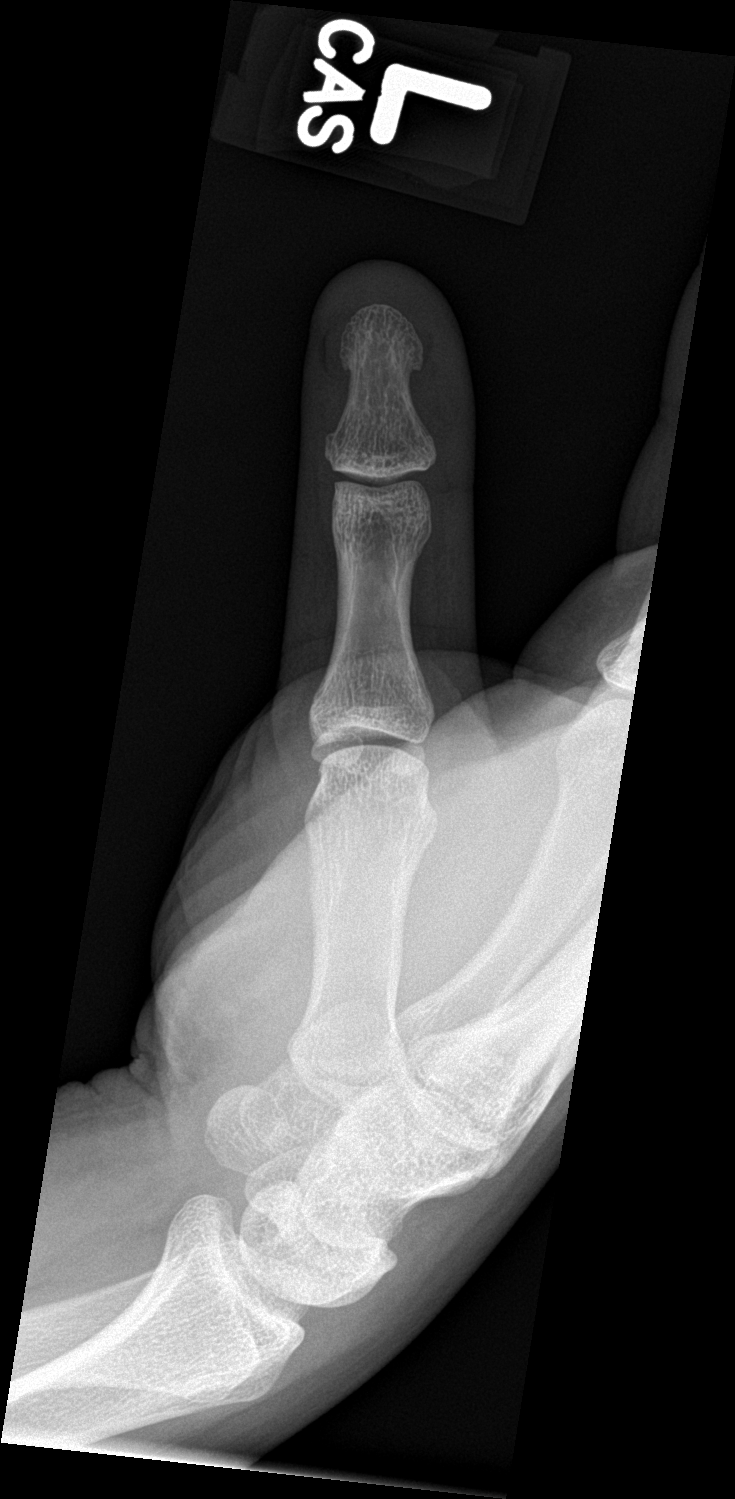

[finger obl]
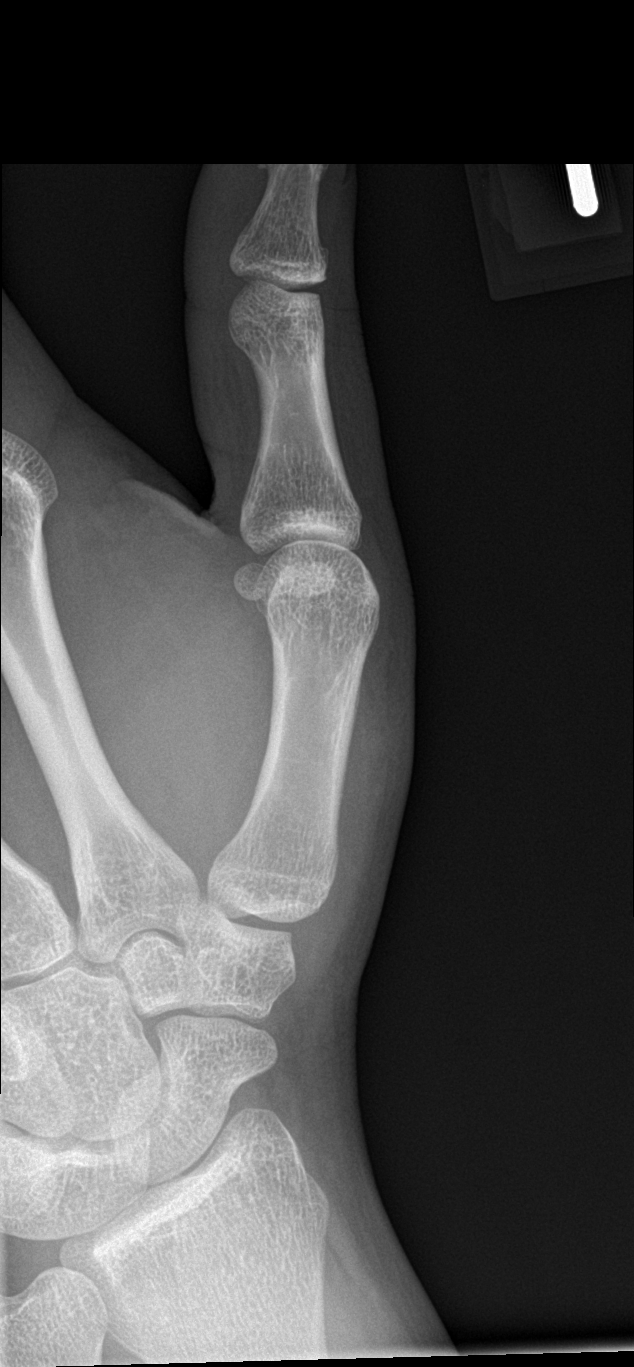

[finger lat]
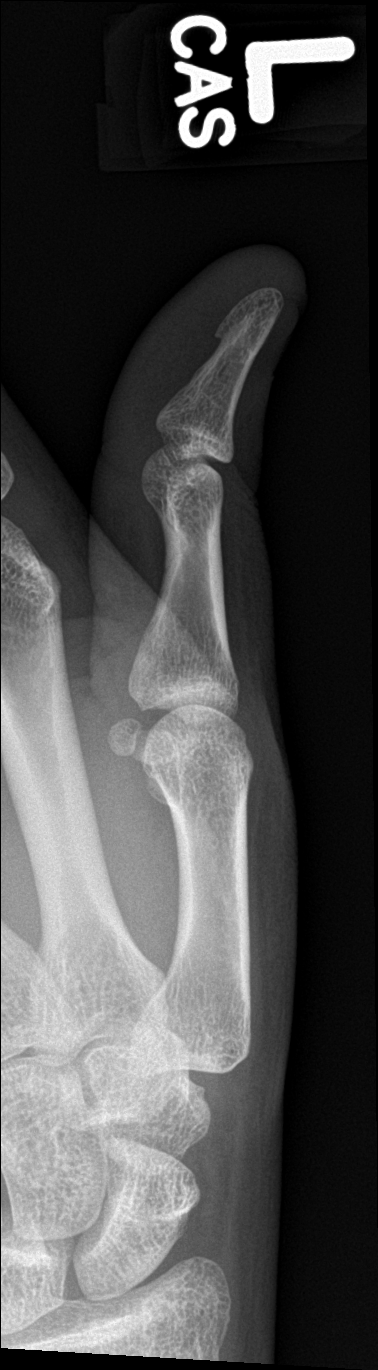

[3 of 3 positions shown; findings below may reference images not displayed]

FINDINGS: There is no evidence of fracture or dislocation. There is no
evidence of arthropathy or other focal bone abnormality. Soft
tissues are unremarkable. No radiopaque foreign body.
IMPRESSION: Negative.

## 2022-02-28 ENCOUNTER — Encounter: Payer: Self-pay | Admitting: Internal Medicine

## 2022-02-28 ENCOUNTER — Ambulatory Visit (INDEPENDENT_AMBULATORY_CARE_PROVIDER_SITE_OTHER): Payer: No Typology Code available for payment source | Admitting: Internal Medicine

## 2022-02-28 VITALS — BP 160/78 | HR 99 | Ht 73.0 in | Wt 206.5 lb

## 2022-02-28 DIAGNOSIS — F419 Anxiety disorder, unspecified: Secondary | ICD-10-CM | POA: Diagnosis not present

## 2022-02-28 DIAGNOSIS — I10 Essential (primary) hypertension: Secondary | ICD-10-CM | POA: Diagnosis not present

## 2022-02-28 DIAGNOSIS — Z1159 Encounter for screening for other viral diseases: Secondary | ICD-10-CM

## 2022-02-28 DIAGNOSIS — Z114 Encounter for screening for human immunodeficiency virus [HIV]: Secondary | ICD-10-CM

## 2022-02-28 DIAGNOSIS — E7800 Pure hypercholesterolemia, unspecified: Secondary | ICD-10-CM

## 2022-02-28 NOTE — Assessment & Plan Note (Signed)
HIV is negative

## 2022-02-28 NOTE — Progress Notes (Signed)
Established Patient Office Visit  Subjective:  Patient ID: Dave Mcintyre, male    DOB: 05-21-1990  Age: 31 y.o. MRN: [id number]  CC:  Chief Complaint  Patient presents with   Anxiety    He states that he has anxiety and it runs his BP up sometimes. He started having panic attacks 2 months ago. Wants to discuss. Has appt with therapist tomorrow.     Anxiety      [place] presents for bp check   Past Medical History:  Diagnosis Date   Anxiety    Depression    Hypertension     History reviewed. No pertinent surgical history.  Family History  Family history unknown: Yes    Social History   Socioeconomic History   Marital status: Single    Spouse name: Not on file   Number of children: Not on file   Years of education: 12   Highest education level: 12th grade  Occupational History   Not on file  Tobacco Use   Smoking status: Every Day    Types: Cigarettes   Smokeless tobacco: Never  Substance and Sexual Activity   Alcohol use: Yes   Drug use: Never   Sexual activity: Yes    Birth control/protection: None  Other Topics Concern   Not on file  Social History Narrative   Not on file   Social Determinants of Health   Financial Resource Strain: Not on file  Food Insecurity: Not on file  Transportation Needs: Not on file  Physical Activity: Not on file  Stress: Not on file  Social Connections: Not on file  Intimate Partner Violence: Not on file     Current Outpatient Medications:    amLODipine (NORVASC) 5 MG tablet, Take 1 tablet (5 mg total) by mouth daily., Disp: 90 tablet, Rfl: 3   citalopram (CELEXA) 10 MG tablet, Take 1 tablet (10 mg total) by mouth daily., Disp: 30 tablet, Rfl: 3   hydrOXYzine (ATARAX) 25 MG tablet, Take 1 tablet (25 mg total) by mouth 2 (two) times daily as needed for up to 30 doses for anxiety., Disp: 30 tablet, Rfl: 0   losartan-hydrochlorothiazide (HYZAAR) 50-12.5 MG tablet, Take 1 tablet by mouth daily., Disp: 90  tablet, Rfl: 2   metoprolol succinate (TOPROL-XL) 50 MG 24 hr tablet, Take 1 tablet (50 mg total) by mouth daily. Take with or immediately following a meal., Disp: 90 tablet, Rfl: 3   simvastatin (ZOCOR) 20 MG tablet, Take 1 tablet (20 mg total) by mouth at bedtime., Disp: 90 tablet, Rfl: 3   Allergies  Allergen Reactions   Penicillins     ROS Review of Systems  Constitutional: Negative.   HENT: Negative.    Eyes: Negative.   Respiratory: Negative.    Cardiovascular: Negative.   Gastrointestinal: Negative.   Endocrine: Negative.   Genitourinary: Negative.   Musculoskeletal: Negative.   Skin: Negative.   Allergic/Immunologic: Negative.   Neurological: Negative.   Hematological: Negative.   Psychiatric/Behavioral: Negative.    All other systems reviewed and are negative.     Objective:    Physical Exam Vitals reviewed.  Constitutional:      Appearance: Normal appearance.  HENT:     Mouth/Throat:     Mouth: Mucous membranes are moist.  Eyes:     Pupils: Pupils are equal, round, and reactive to light.  Neck:     Vascular: No carotid bruit.  Cardiovascular:     Rate and Rhythm: Normal rate  and regular rhythm.     Pulses: Normal pulses.     Heart sounds: Normal heart sounds.  Pulmonary:     Effort: Pulmonary effort is normal.     Breath sounds: Normal breath sounds.  Abdominal:     General: Bowel sounds are normal.     Palpations: Abdomen is soft. There is no hepatomegaly, splenomegaly or mass.     Tenderness: There is no abdominal tenderness.     Hernia: No hernia is present.  Musculoskeletal:     Cervical back: Neck supple.     Right lower leg: No edema.     Left lower leg: No edema.  Skin:    Findings: No rash.  Neurological:     Mental Status: He is alert and oriented to person, place, and time.     Motor: No weakness.  Psychiatric:        Mood and Affect: Mood normal.        Behavior: Behavior normal.     BP (!) 160/78   Pulse 99   Ht _0  (1.854  m)   Wt 206 lb 8 oz (93.7 kg)   SpO2 99%   BMI 27.24 kg/m  Wt Readings from Last 3 Encounters:  02/28/22 206 lb 8 oz (93.7 kg)  01/07/22 214 lb (97.1 kg)  06/07/21 213 lb 6.4 oz (96.8 kg)     Health Maintenance Due  Topic Date Due   COVID-19 Vaccine (1) Never done   INFLUENZA VACCINE  Never done    There are no preventive care reminders to display for this patient.  Lab Results  Component Value Date   TSH 1.93 04/04/2021   Lab Results  Component Value Date   WBC 8.1 04/04/2021   HGB 16.7 04/04/2021   HCT 47.8 04/04/2021   MCV 94.8 04/04/2021   PLT 278 04/04/2021   Lab Results  Component Value Date   NA 139 04/04/2021   K 3.6 04/04/2021   CO2 27 04/04/2021   GLUCOSE 90 04/04/2021   BUN 12 04/04/2021   CREATININE 0.83 04/04/2021   BILITOT 0.7 04/04/2021   ALKPHOS 113 08/18/2018   AST 21 04/04/2021   ALT 26 04/04/2021   PROT 7.5 04/04/2021   ALBUMIN 4.1 08/18/2018   CALCIUM 9.5 04/04/2021   ANIONGAP 10 08/18/2018   EGFR 121 04/04/2021   Lab Results  Component Value Date   CHOL 304 (H) 04/04/2021   Lab Results  Component Value Date   HDL 59 04/04/2021   Lab Results  Component Value Date   LDLCALC 210 (H) 04/04/2021   Lab Results  Component Value Date   TRIG 181 (H) 04/04/2021   Lab Results  Component Value Date   CHOLHDL 5.2 (H) 04/04/2021   No results found for: "HGBA1C"    Assessment & Plan:   Problem List Items Addressed This Visit       Cardiovascular and Mediastinum   Primary hypertension - Primary     Patient denies any chest pain or shortness of breath there is no history of palpitation or paroxysmal nocturnal dyspnea   patient was advised to follow low-salt low-cholesterol diet    ideally I want to keep systolic blood pressure below 130 mmHg, patient was asked to check blood pressure one times a week and give me a report on that.  Patient will be follow-up in 3 months  or earlier as needed, patient will call me back for any change  in the cardiovascular symptoms Patient was  advised to buy a book from local bookstore concerning blood pressure and read several chapters  every day.  This will be supplemented by some of the material we will give him from the office.  Patient should also utilize other resources like YouTube and Internet to learn more about the blood pressure and the diet.        Other   Anxiety    - Patient experiencing high levels of anxiety.  - Encouraged patient to engage in relaxing activities like yoga, meditation, journaling, going for a walk, or participating in a hobby.  - Encouraged patient to reach out to trusted friends or family members about recent struggles, Patient was advised to read A book, how to stop worrying and start living, it is good book to read to control  the stress       Need for hepatitis C screening test    Hepatitis C is negative      Encounter for screening for HIV    HIV is negative      Pure hypercholesterolemia    Hypercholesterolemia  I advised the patient to follow Mediterranean diet This diet is rich in fruits vegetables and whole grain, and This diet is also rich in fish and lean meat Patient should also eat a handful of almonds or walnuts daily Recent heart study indicated that average follow-up on this kind of diet reduces the cardiovascular mortality by 50 to 70%==       No orders of the defined types were placed in this encounter.   Follow-up: No follow-ups on file.    Cletis Athens, MD

## 2022-02-28 NOTE — Assessment & Plan Note (Signed)

## 2022-02-28 NOTE — Assessment & Plan Note (Signed)
Hypercholesterolemia  I advised the patient to follow Mediterranean diet This diet is rich in fruits vegetables and whole grain, and This diet is also rich in fish and lean meat Patient should also eat a handful of almonds or walnuts daily Recent heart study indicated that average follow-up on this kind of diet reduces the cardiovascular mortality by 50 to 70%== 

## 2022-02-28 NOTE — Assessment & Plan Note (Signed)
-   Patient experiencing high levels of anxiety.  - Encouraged patient to engage in relaxing activities like yoga, meditation, journaling, going for a walk, or participating in a hobby.  - Encouraged patient to reach out to trusted friends or family members about recent struggles, Patient was advised to read A book, how to stop worrying and start living, it is good book to read to control  the stress  

## 2022-02-28 NOTE — Assessment & Plan Note (Signed)
Hepatitis C is negative.

## 2022-03-13 IMAGING — US US EXTREM LOW VENOUS*R*
1 series · 14 of 24 positions shown · non-contrast
Comparison: None

CLINICAL DATA: RIGHT leg pain for 2 days

EXAM:
RIGHT LOWER EXTREMITY VENOUS DOPPLER ULTRASOUND
TECHNIQUE: Gray-scale sonography with compression, as well as color and duplex
ultrasound, were performed to evaluate the deep venous system(s)
from the level of the common femoral vein through the popliteal and
proximal calf veins.

[Series 1: us venous img lower uni right (dvt) · portal-venous · 14 of 34 slices shown]
[im 1/34]
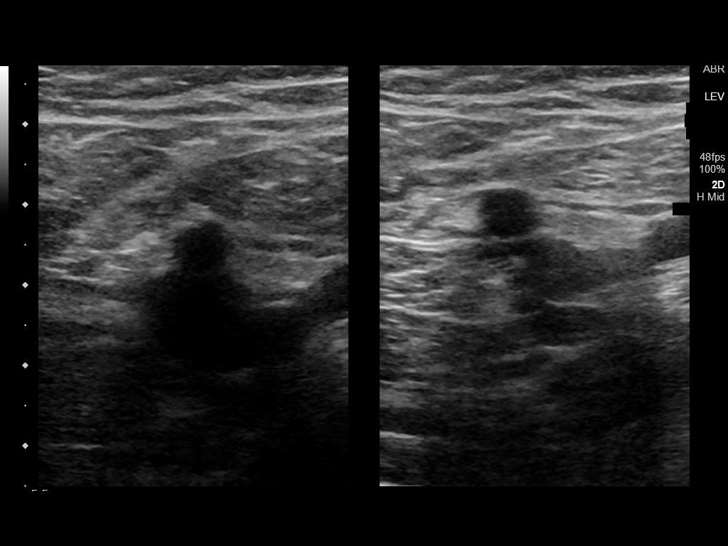
[im 3/34]
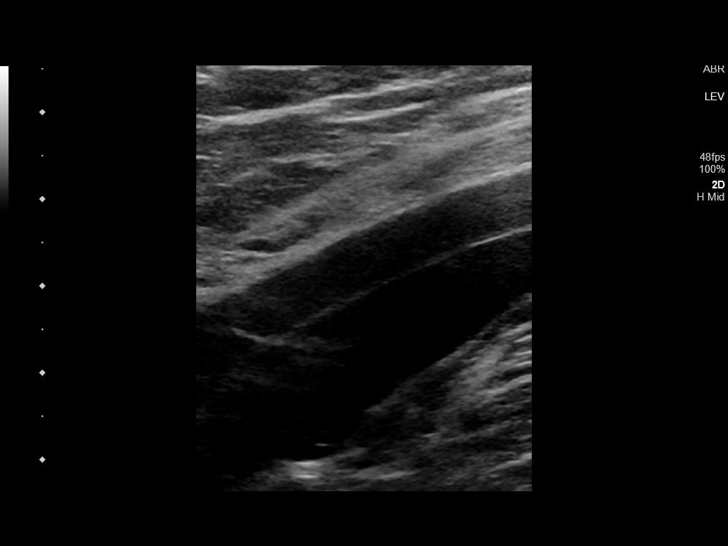
[im 6/34]
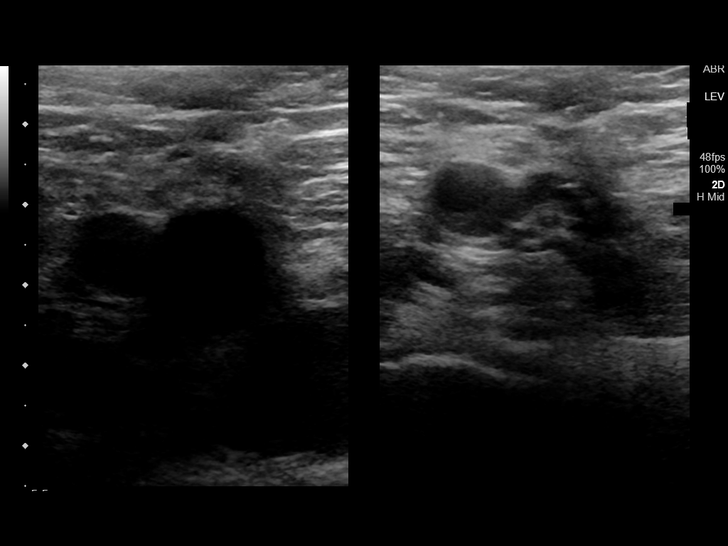
[im 9/34]
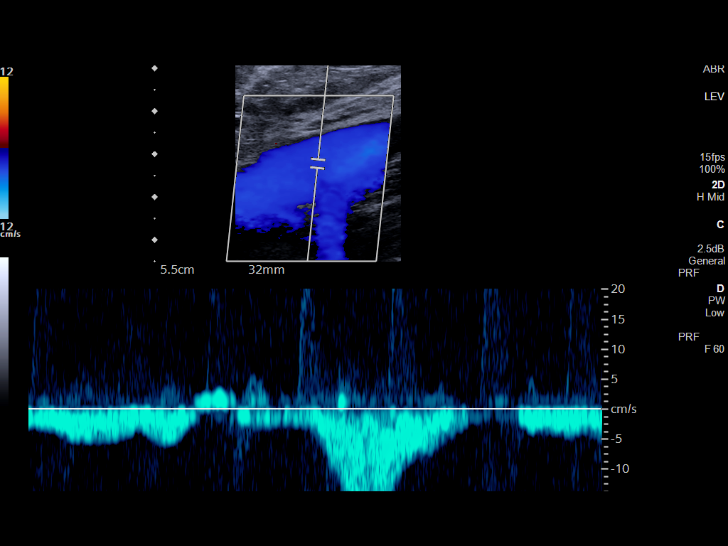
[im 11/34]
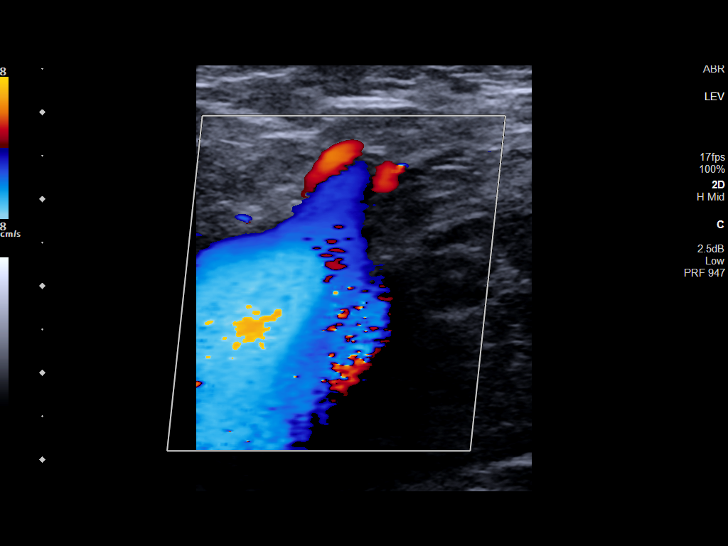
[im 13/34]
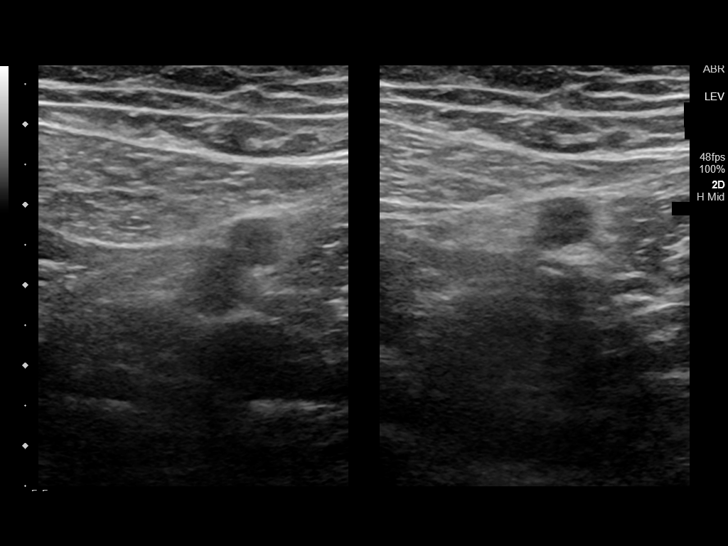
[im 16/34]
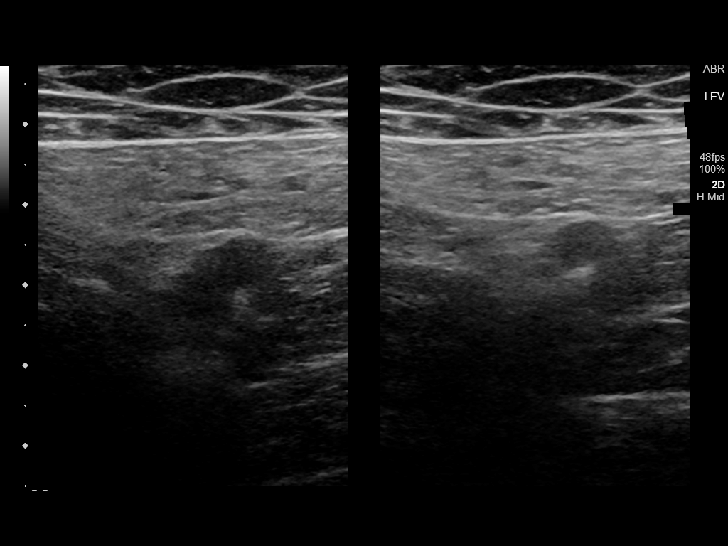
[im 18/34]
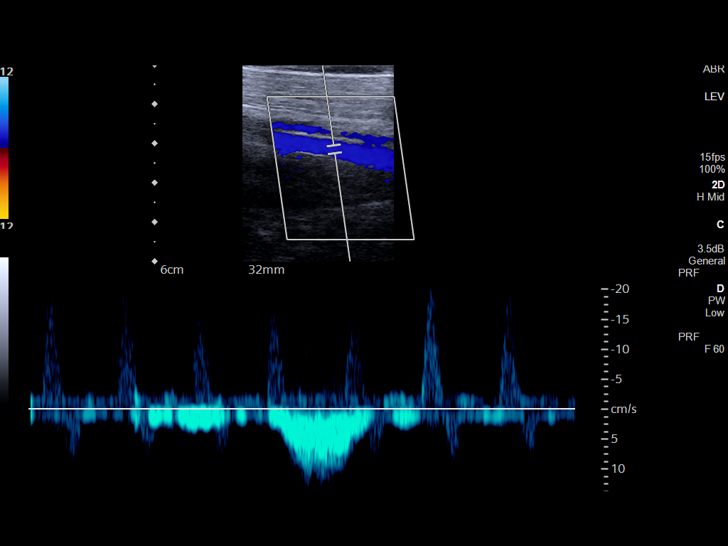
[im 21/34]
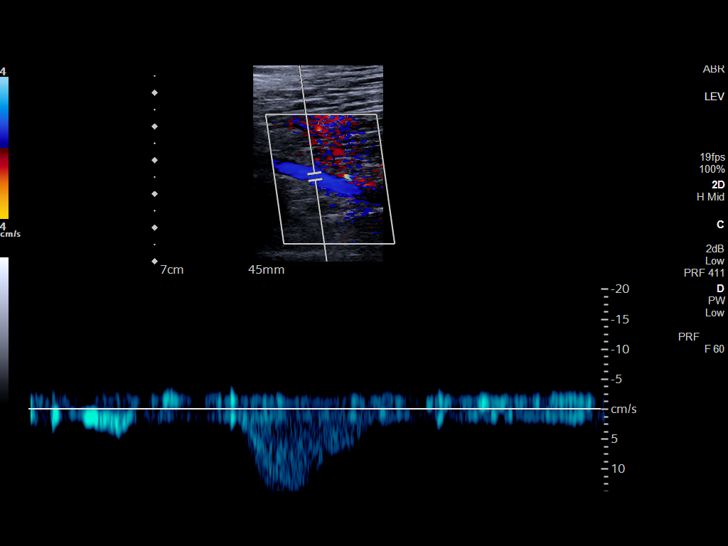
[im 23/34]
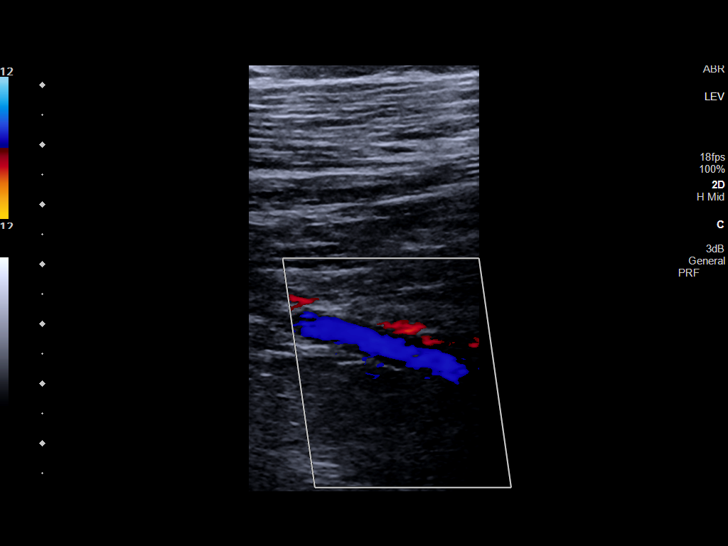
[im 26/34]
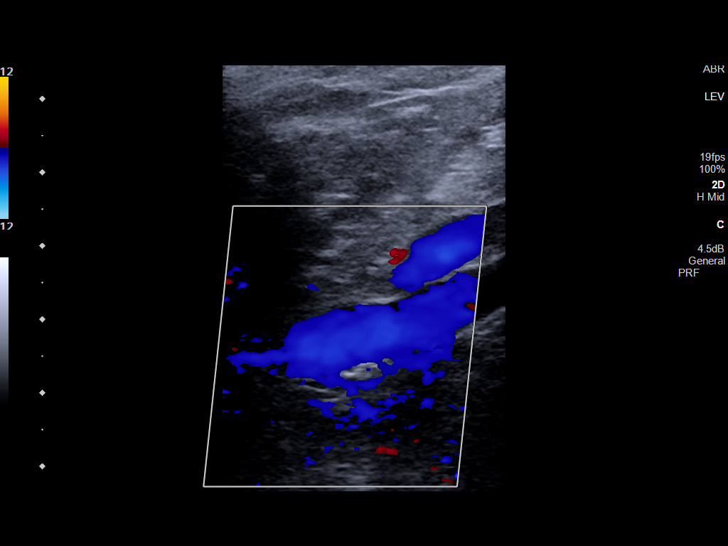
[im 28/34]
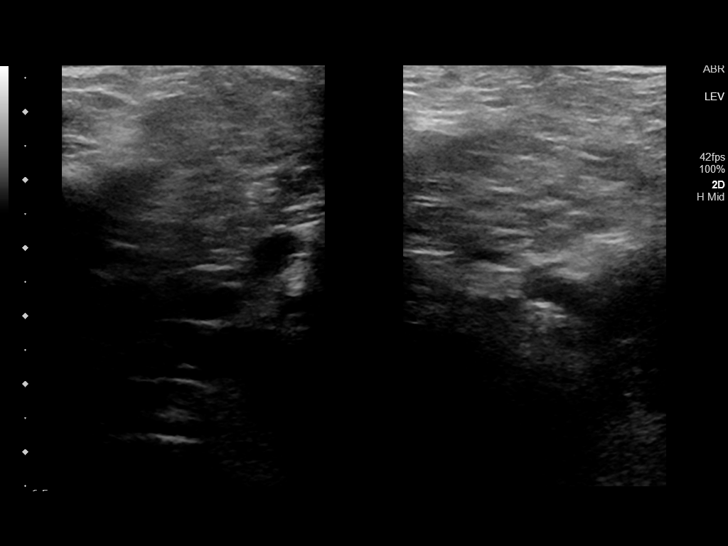
[im 31/34]
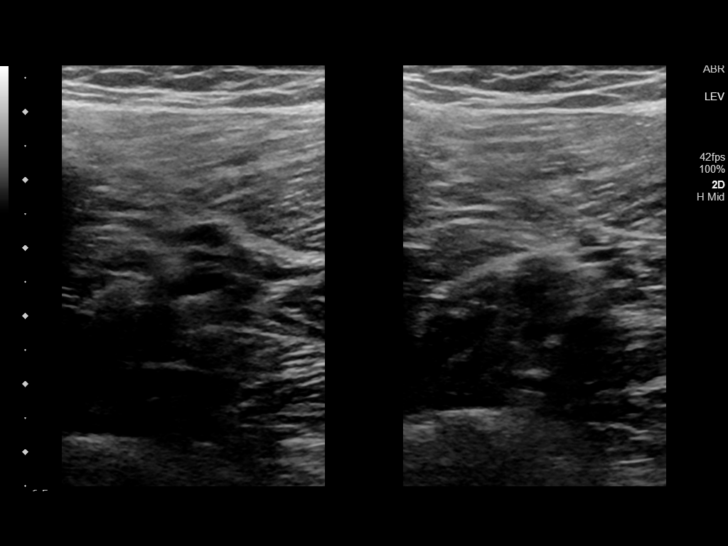
[im 34/34]
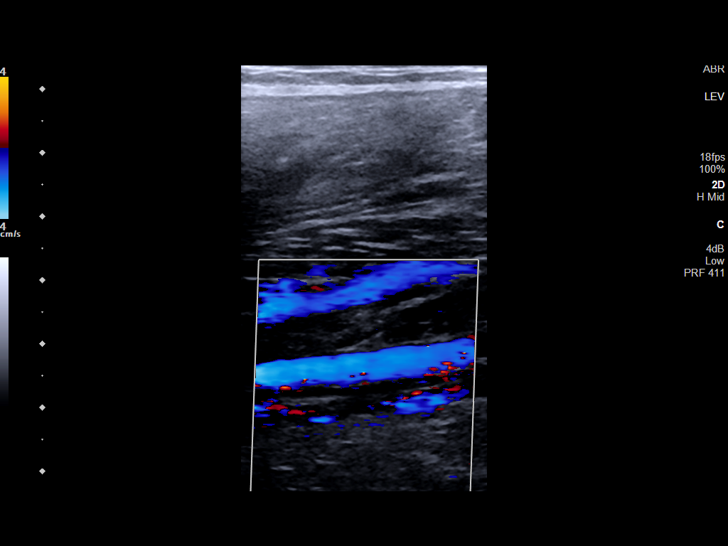

[14 of 24 positions shown; findings below may reference images not displayed]

FINDINGS: VENOUS

Normal compressibility of the common femoral, superficial femoral,
and popliteal veins, as well as the visualized calf veins.
Visualized portions of profunda femoral vein and great saphenous
vein unremarkable. No filling defects to suggest DVT on grayscale or
color Doppler imaging. Doppler waveforms show normal direction of
venous flow, normal respiratory plasticity and response to
augmentation.

Limited views of the contralateral common femoral vein are
unremarkable.

OTHER

None.

Limitations: none
IMPRESSION: No evidence of deep venous thrombosis in the RIGHT lower extremity.

## 2022-03-23 ENCOUNTER — Other Ambulatory Visit: Payer: Self-pay | Admitting: Internal Medicine

## 2022-03-23 DIAGNOSIS — I10 Essential (primary) hypertension: Secondary | ICD-10-CM
